# Patient Record
Sex: Female | Born: 1980 | Race: Black or African American | Hispanic: No | Marital: Married | Smoking: Never smoker
Health system: Southern US, Community
[De-identification: ages and names within clinical notes are randomized; demographics above are authoritative.]

## PROBLEM LIST (undated history)

## (undated) ENCOUNTER — Inpatient Hospital Stay (HOSPITAL_COMMUNITY): Payer: Self-pay

## (undated) DIAGNOSIS — R12 Heartburn: Secondary | ICD-10-CM

## (undated) DIAGNOSIS — O26899 Other specified pregnancy related conditions, unspecified trimester: Secondary | ICD-10-CM

## (undated) DIAGNOSIS — Z789 Other specified health status: Secondary | ICD-10-CM

## (undated) DIAGNOSIS — R059 Cough, unspecified: Secondary | ICD-10-CM

## (undated) DIAGNOSIS — R05 Cough: Secondary | ICD-10-CM

## (undated) HISTORY — PX: UMBILICAL HERNIA REPAIR: SHX196

## (undated) HISTORY — PX: HERNIA REPAIR: SHX51

## (undated) HISTORY — PX: FOOT SURGERY: SHX648

---

## 1998-07-09 ENCOUNTER — Emergency Department (HOSPITAL_COMMUNITY): Admission: EM | Admit: 1998-07-09 | Discharge: 1998-07-09 | Payer: Self-pay | Admitting: Emergency Medicine

## 1999-09-26 ENCOUNTER — Other Ambulatory Visit: Admission: RE | Admit: 1999-09-26 | Discharge: 1999-09-26 | Payer: Self-pay | Admitting: Obstetrics

## 1999-12-30 ENCOUNTER — Inpatient Hospital Stay (HOSPITAL_COMMUNITY): Admission: AD | Admit: 1999-12-30 | Discharge: 1999-12-30 | Payer: Self-pay | Admitting: Obstetrics

## 2000-03-30 ENCOUNTER — Inpatient Hospital Stay (HOSPITAL_COMMUNITY): Admission: AD | Admit: 2000-03-30 | Discharge: 2000-03-30 | Payer: Self-pay | Admitting: *Deleted

## 2000-03-30 ENCOUNTER — Inpatient Hospital Stay (HOSPITAL_COMMUNITY): Admission: AD | Admit: 2000-03-30 | Discharge: 2000-04-02 | Payer: Self-pay | Admitting: Obstetrics

## 2001-01-09 ENCOUNTER — Emergency Department (HOSPITAL_COMMUNITY): Admission: EM | Admit: 2001-01-09 | Discharge: 2001-01-09 | Payer: Self-pay

## 2001-02-07 ENCOUNTER — Emergency Department (HOSPITAL_COMMUNITY): Admission: EM | Admit: 2001-02-07 | Discharge: 2001-02-08 | Payer: Self-pay | Admitting: Emergency Medicine

## 2003-05-09 ENCOUNTER — Inpatient Hospital Stay (HOSPITAL_COMMUNITY): Admission: AD | Admit: 2003-05-09 | Discharge: 2003-05-09 | Payer: Self-pay | Admitting: Obstetrics

## 2004-08-30 ENCOUNTER — Inpatient Hospital Stay (HOSPITAL_COMMUNITY): Admission: AD | Admit: 2004-08-30 | Discharge: 2004-08-30 | Payer: Self-pay | Admitting: Obstetrics

## 2004-09-02 ENCOUNTER — Inpatient Hospital Stay (HOSPITAL_COMMUNITY): Admission: AD | Admit: 2004-09-02 | Discharge: 2004-09-02 | Payer: Self-pay | Admitting: Obstetrics

## 2004-12-28 ENCOUNTER — Inpatient Hospital Stay (HOSPITAL_COMMUNITY): Admission: AD | Admit: 2004-12-28 | Discharge: 2004-12-28 | Payer: Self-pay | Admitting: Obstetrics

## 2005-05-30 ENCOUNTER — Inpatient Hospital Stay (HOSPITAL_COMMUNITY): Admission: AD | Admit: 2005-05-30 | Discharge: 2005-05-30 | Payer: Self-pay | Admitting: Obstetrics

## 2005-12-20 ENCOUNTER — Inpatient Hospital Stay (HOSPITAL_COMMUNITY): Admission: AD | Admit: 2005-12-20 | Discharge: 2005-12-20 | Payer: Self-pay | Admitting: Obstetrics

## 2006-10-28 ENCOUNTER — Emergency Department (HOSPITAL_COMMUNITY): Admission: EM | Admit: 2006-10-28 | Discharge: 2006-10-28 | Payer: Self-pay | Admitting: Emergency Medicine

## 2007-01-24 ENCOUNTER — Emergency Department (HOSPITAL_COMMUNITY): Admission: EM | Admit: 2007-01-24 | Discharge: 2007-01-24 | Payer: Self-pay | Admitting: Emergency Medicine

## 2007-03-18 ENCOUNTER — Inpatient Hospital Stay (HOSPITAL_COMMUNITY): Admission: AD | Admit: 2007-03-18 | Discharge: 2007-03-19 | Payer: Self-pay | Admitting: Obstetrics

## 2007-05-09 ENCOUNTER — Emergency Department (HOSPITAL_COMMUNITY): Admission: EM | Admit: 2007-05-09 | Discharge: 2007-05-09 | Payer: Self-pay | Admitting: Emergency Medicine

## 2007-09-06 ENCOUNTER — Emergency Department (HOSPITAL_COMMUNITY): Admission: EM | Admit: 2007-09-06 | Discharge: 2007-09-06 | Payer: Self-pay | Admitting: Emergency Medicine

## 2008-03-01 ENCOUNTER — Emergency Department (HOSPITAL_COMMUNITY): Admission: EM | Admit: 2008-03-01 | Discharge: 2008-03-01 | Payer: Self-pay | Admitting: Emergency Medicine

## 2008-04-13 ENCOUNTER — Emergency Department (HOSPITAL_COMMUNITY): Admission: EM | Admit: 2008-04-13 | Discharge: 2008-04-13 | Payer: Self-pay | Admitting: Emergency Medicine

## 2008-05-31 ENCOUNTER — Emergency Department (HOSPITAL_COMMUNITY): Admission: EM | Admit: 2008-05-31 | Discharge: 2008-05-31 | Payer: Self-pay | Admitting: Emergency Medicine

## 2008-07-16 ENCOUNTER — Emergency Department (HOSPITAL_COMMUNITY): Admission: EM | Admit: 2008-07-16 | Discharge: 2008-07-16 | Payer: Self-pay | Admitting: Family Medicine

## 2009-11-18 ENCOUNTER — Emergency Department (HOSPITAL_COMMUNITY): Admission: EM | Admit: 2009-11-18 | Discharge: 2009-11-18 | Payer: Self-pay | Admitting: Family Medicine

## 2009-12-05 ENCOUNTER — Emergency Department (HOSPITAL_COMMUNITY): Admission: EM | Admit: 2009-12-05 | Discharge: 2009-12-05 | Payer: Self-pay | Admitting: Emergency Medicine

## 2010-02-28 ENCOUNTER — Ambulatory Visit (HOSPITAL_COMMUNITY)
Admission: RE | Admit: 2010-02-28 | Discharge: 2010-02-28 | Payer: Self-pay | Source: Home / Self Care | Attending: General Surgery | Admitting: General Surgery

## 2010-03-19 ENCOUNTER — Emergency Department (HOSPITAL_COMMUNITY)
Admission: EM | Admit: 2010-03-19 | Discharge: 2010-03-19 | Payer: Self-pay | Source: Home / Self Care | Admitting: Emergency Medicine

## 2010-05-29 LAB — COMPREHENSIVE METABOLIC PANEL
ALT: 20 U/L (ref 0–35)
Albumin: 4 g/dL (ref 3.5–5.2)
Alkaline Phosphatase: 81 U/L (ref 39–117)
CO2: 26 mEq/L (ref 19–32)
Creatinine, Ser: 0.97 mg/dL (ref 0.4–1.2)
GFR calc Af Amer: >60 mL/min (ref >60)
Glucose, Bld: 113 mg/dL — ABNORMAL HIGH (ref 70–99)
Potassium: 3.3 mEq/L — ABNORMAL LOW (ref 3.5–5.1)
Sodium: 139 mEq/L (ref 135–145)
Total Bilirubin: 0.6 mg/dL (ref 0.3–1.2)

## 2010-05-29 LAB — DIFFERENTIAL
Basophils Absolute: 0 10*3/uL (ref 0.0–0.1)
Basophils Relative: 0 % (ref 0–1)
Eosinophils Relative: 0 % (ref 0–5)
Lymphocytes Relative: 18 % (ref 12–46)
Monocytes Relative: 19 % — ABNORMAL HIGH (ref 3–12)

## 2010-05-29 LAB — CBC
Hemoglobin: 13.7 g/dL (ref 12.0–15.0)
MCH: 29 pg (ref 26.0–34.0)
MCHC: 33.1 g/dL (ref 30.0–36.0)
MCHC: 31.7 g/dL (ref 30.0–36.0)
MCV: 87.7 fL (ref 78.0–100.0)
RBC: 4.32 MIL/uL (ref 3.87–5.11)
RDW: 13.8 % (ref 11.5–15.5)
WBC: 7.2 10*3/uL (ref 4.0–10.5)
WBC: 5.6 10*3/uL (ref 4.0–10.5)

## 2010-05-29 LAB — URINALYSIS, ROUTINE W REFLEX MICROSCOPIC
Leukocytes, UA: NEGATIVE
pH: 6 (ref 5.0–8.0)

## 2010-05-29 LAB — URINE MICROSCOPIC-ADD ON

## 2010-05-29 LAB — SURGICAL PCR SCREEN
MRSA, PCR: NEGATIVE
Staphylococcus aureus: NEGATIVE

## 2010-06-01 ENCOUNTER — Emergency Department (HOSPITAL_COMMUNITY): Payer: No Typology Code available for payment source

## 2010-06-01 ENCOUNTER — Emergency Department (HOSPITAL_COMMUNITY)
Admission: EM | Admit: 2010-06-01 | Discharge: 2010-06-01 | Disposition: A | Discharge status: Home / Self Care | Payer: No Typology Code available for payment source | Attending: Emergency Medicine | Admitting: Emergency Medicine

## 2010-06-01 DIAGNOSIS — M545 Low back pain, unspecified: Secondary | ICD-10-CM | POA: Insufficient documentation

## 2010-06-01 DIAGNOSIS — M79609 Pain in unspecified limb: Secondary | ICD-10-CM | POA: Insufficient documentation

## 2010-06-01 LAB — POCT URINALYSIS DIPSTICK
Nitrite: NEGATIVE
Protein, ur: NEGATIVE mg/dL
Specific Gravity, Urine: >=1.03 (ref 1.005–1.030)
pH: 6 (ref 5.0–8.0)

## 2010-06-01 LAB — POCT PREGNANCY, URINE: Preg Test, Ur: NEGATIVE

## 2010-06-28 LAB — HERPES SIMPLEX VIRUS CULTURE: Culture: DETECTED

## 2010-06-28 LAB — CULTURE, ROUTINE-ABSCESS: Gram Stain: NONE SEEN

## 2010-06-28 LAB — WET PREP, GENITAL: Yeast Wet Prep HPF POC: NONE SEEN

## 2010-06-29 LAB — POCT URINALYSIS DIP (DEVICE)
Protein, ur: NEGATIVE mg/dL
pH: 7 (ref 5.0–8.0)

## 2010-07-03 LAB — POCT URINALYSIS DIP (DEVICE)
Bilirubin Urine: NEGATIVE
Hgb urine dipstick: NEGATIVE
Specific Gravity, Urine: 1.015 (ref 1.005–1.030)

## 2010-07-03 LAB — WET PREP, GENITAL
WBC, Wet Prep HPF POC: NONE SEEN
Yeast Wet Prep HPF POC: NONE SEEN

## 2010-07-03 LAB — GC/CHLAMYDIA PROBE AMP, GENITAL: GC Probe Amp, Genital: NEGATIVE

## 2010-07-04 ENCOUNTER — Ambulatory Visit: Payer: No Typology Code available for payment source | Admitting: Rehabilitative and Restorative Service Providers"

## 2010-07-11 ENCOUNTER — Ambulatory Visit: Payer: Medicaid Other | Attending: Orthopedic Surgery

## 2010-07-24 ENCOUNTER — Ambulatory Visit: Payer: No Typology Code available for payment source | Admitting: Physical Therapy

## 2010-07-25 ENCOUNTER — Ambulatory Visit: Payer: No Typology Code available for payment source | Attending: Orthopedic Surgery

## 2010-07-25 DIAGNOSIS — R293 Abnormal posture: Secondary | ICD-10-CM | POA: Insufficient documentation

## 2010-07-25 DIAGNOSIS — R51 Headache: Secondary | ICD-10-CM | POA: Insufficient documentation

## 2010-07-25 DIAGNOSIS — IMO0001 Reserved for inherently not codable concepts without codable children: Secondary | ICD-10-CM | POA: Insufficient documentation

## 2010-07-25 DIAGNOSIS — R5381 Other malaise: Secondary | ICD-10-CM | POA: Insufficient documentation

## 2010-07-25 DIAGNOSIS — R109 Unspecified abdominal pain: Secondary | ICD-10-CM | POA: Insufficient documentation

## 2010-07-25 DIAGNOSIS — M542 Cervicalgia: Secondary | ICD-10-CM | POA: Insufficient documentation

## 2010-08-02 ENCOUNTER — Ambulatory Visit: Payer: No Typology Code available for payment source

## 2010-08-09 ENCOUNTER — Ambulatory Visit: Payer: No Typology Code available for payment source | Admitting: Physical Therapy

## 2010-12-08 LAB — WET PREP, GENITAL
Trich, Wet Prep: NONE SEEN
Yeast Wet Prep HPF POC: NONE SEEN

## 2010-12-08 LAB — PREGNANCY, URINE: Preg Test, Ur: NEGATIVE

## 2010-12-08 LAB — URINALYSIS, ROUTINE W REFLEX MICROSCOPIC
Glucose, UA: NEGATIVE
Ketones, ur: NEGATIVE
Leukocytes, UA: NEGATIVE
Nitrite: NEGATIVE
Protein, ur: NEGATIVE
Specific Gravity, Urine: 1.023
Urobilinogen, UA: 1
pH: 6.5

## 2010-12-08 LAB — URINE MICROSCOPIC-ADD ON

## 2010-12-14 LAB — POCT URINALYSIS DIP (DEVICE)
Bilirubin Urine: NEGATIVE
Ketones, ur: NEGATIVE
Operator id: 282151
pH: 7

## 2010-12-14 LAB — WET PREP, GENITAL
Clue Cells Wet Prep HPF POC: NONE SEEN
Yeast Wet Prep HPF POC: NONE SEEN

## 2010-12-14 LAB — POCT PREGNANCY, URINE: Operator id: 282151

## 2010-12-21 LAB — POCT URINALYSIS DIP (DEVICE)
Hgb urine dipstick: NEGATIVE
Protein, ur: NEGATIVE mg/dL
Specific Gravity, Urine: 1.015 (ref 1.005–1.030)
Urobilinogen, UA: 0.2 mg/dL (ref 0.0–1.0)
pH: 6.5 (ref 5.0–8.0)

## 2010-12-21 LAB — GC/CHLAMYDIA PROBE AMP, GENITAL
Chlamydia, DNA Probe: NEGATIVE
GC Probe Amp, Genital: NEGATIVE

## 2010-12-22 LAB — URINE MICROSCOPIC-ADD ON

## 2010-12-22 LAB — CBC
HCT: 31.9 — ABNORMAL LOW
Hemoglobin: 10.7 — ABNORMAL LOW
RDW: 14.5

## 2010-12-22 LAB — POCT PREGNANCY, URINE: Preg Test, Ur: POSITIVE

## 2010-12-22 LAB — URINALYSIS, ROUTINE W REFLEX MICROSCOPIC
Bilirubin Urine: NEGATIVE
Nitrite: NEGATIVE
Specific Gravity, Urine: 1.015
Urobilinogen, UA: 0.2

## 2010-12-22 LAB — GC/CHLAMYDIA PROBE AMP, GENITAL: GC Probe Amp, Genital: NEGATIVE

## 2010-12-22 LAB — WET PREP, GENITAL
Clue Cells Wet Prep HPF POC: NONE SEEN
Trich, Wet Prep: NONE SEEN
Yeast Wet Prep HPF POC: NONE SEEN

## 2010-12-26 LAB — POCT URINALYSIS DIP (DEVICE)
Hgb urine dipstick: NEGATIVE
Ketones, ur: NEGATIVE
Protein, ur: NEGATIVE
Specific Gravity, Urine: 1.025
pH: 6.5

## 2010-12-26 LAB — WET PREP, GENITAL: Trich, Wet Prep: NONE SEEN

## 2010-12-26 LAB — POCT PREGNANCY, URINE: Operator id: 116391

## 2011-05-06 ENCOUNTER — Ambulatory Visit (INDEPENDENT_AMBULATORY_CARE_PROVIDER_SITE_OTHER): Payer: BC Managed Care – PPO | Admitting: Family Medicine

## 2011-05-06 VITALS — BP 96/63 | HR 80 | Temp 98.7°F | Resp 12 | Ht 62.0 in | Wt 180.0 lb

## 2011-05-06 DIAGNOSIS — N939 Abnormal uterine and vaginal bleeding, unspecified: Secondary | ICD-10-CM

## 2011-05-06 DIAGNOSIS — N898 Other specified noninflammatory disorders of vagina: Secondary | ICD-10-CM
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
## 2011-05-11 ENCOUNTER — Other Ambulatory Visit (HOSPITAL_COMMUNITY): Payer: Self-pay | Admitting: Obstetrics

## 2011-05-11 DIAGNOSIS — IMO0002 Reserved for concepts with insufficient information to code with codable children: Secondary | ICD-10-CM

## 2011-05-16 ENCOUNTER — Ambulatory Visit (HOSPITAL_COMMUNITY): Payer: No Typology Code available for payment source

## 2011-05-18 ENCOUNTER — Ambulatory Visit (HOSPITAL_COMMUNITY)
Admission: RE | Admit: 2011-05-18 | Discharge: 2011-05-18 | Disposition: A | Discharge status: Home / Self Care | Payer: BC Managed Care – PPO | Source: Ambulatory Visit | Attending: Obstetrics | Admitting: Obstetrics

## 2011-05-18 DIAGNOSIS — IMO0002 Reserved for concepts with insufficient information to code with codable children: Secondary | ICD-10-CM | POA: Insufficient documentation

## 2013-06-03 ENCOUNTER — Ambulatory Visit: Payer: BC Managed Care – PPO

## 2013-09-05 ENCOUNTER — Encounter (HOSPITAL_COMMUNITY): Payer: Self-pay | Admitting: *Deleted

## 2013-09-05 ENCOUNTER — Inpatient Hospital Stay (HOSPITAL_COMMUNITY)
Admission: AD | Admit: 2013-09-05 | Discharge: 2013-09-05 | Disposition: A | Discharge status: Home / Self Care | Payer: BC Managed Care – PPO | Source: Ambulatory Visit | Attending: Obstetrics | Admitting: Obstetrics

## 2013-09-05 DIAGNOSIS — O99891 Other specified diseases and conditions complicating pregnancy: Secondary | ICD-10-CM | POA: Insufficient documentation

## 2013-09-05 DIAGNOSIS — Y92410 Unspecified street and highway as the place of occurrence of the external cause: Secondary | ICD-10-CM | POA: Insufficient documentation

## 2013-09-05 DIAGNOSIS — Z711 Person with feared health complaint in whom no diagnosis is made: Secondary | ICD-10-CM

## 2013-09-05 DIAGNOSIS — O9989 Other specified diseases and conditions complicating pregnancy, childbirth and the puerperium: Principal | ICD-10-CM

## 2013-09-05 HISTORY — DX: Other specified health status: Z78.9

## 2013-09-05 LAB — POCT PREGNANCY, URINE: Preg Test, Ur: POSITIVE — AB

## 2013-09-05 NOTE — MAU Provider Note (Signed)
Attestation of Attending Supervision of Advanced Practitioner (CNM/NP): Evaluation and management procedures were performed by the Advanced Practitioner under my supervision and collaboration.  I have reviewed the Advanced Practitioner's note and chart, and I agree with the management and plan.  Barbara Yang,Barbara Yang 09/05/2013 9:59 PM
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
## 2013-09-21 ENCOUNTER — Other Ambulatory Visit (HOSPITAL_COMMUNITY): Payer: Self-pay | Admitting: Obstetrics

## 2013-09-21 ENCOUNTER — Encounter (HOSPITAL_COMMUNITY): Payer: Self-pay

## 2013-09-21 ENCOUNTER — Inpatient Hospital Stay (HOSPITAL_COMMUNITY)
Admission: AD | Admit: 2013-09-21 | Discharge: 2013-09-21 | Disposition: A | Discharge status: Home / Self Care | Payer: BC Managed Care – PPO | Source: Ambulatory Visit | Attending: Obstetrics | Admitting: Obstetrics

## 2013-09-21 ENCOUNTER — Inpatient Hospital Stay (HOSPITAL_COMMUNITY): Payer: BC Managed Care – PPO

## 2013-09-21 DIAGNOSIS — O021 Missed abortion: Secondary | ICD-10-CM

## 2013-09-21 LAB — URINALYSIS, ROUTINE W REFLEX MICROSCOPIC
BILIRUBIN URINE: NEGATIVE
Glucose, UA: NEGATIVE mg/dL
KETONES UR: 15 mg/dL — AB
Leukocytes, UA: NEGATIVE
Nitrite: NEGATIVE
PH: 6 (ref 5.0–8.0)
Protein, ur: NEGATIVE mg/dL
SPECIFIC GRAVITY, URINE: 1.025 (ref 1.005–1.030)
UROBILINOGEN UA: 1 mg/dL (ref 0.0–1.0)

## 2013-09-21 LAB — URINE MICROSCOPIC-ADD ON
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
## 2013-09-21 NOTE — Discharge Instructions (Signed)
Miscarriage A miscarriage is the sudden loss of an unborn baby (fetus) before the 20th week of pregnancy. Most miscarriages happen in the first 3 months of pregnancy. Sometimes, it happens before a woman even knows she is pregnant. A miscarriage is also called a "spontaneous miscarriage" or "early pregnancy loss." Having a miscarriage can be an emotional experience. Talk with your caregiver about any questions you may have about miscarrying, the grieving process, and your future pregnancy plans. CAUSES   Problems with the fetal chromosomes that make it impossible for the baby to develop normally. Problems with the baby's genes or chromosomes are most often the result of errors that occur, by chance, as the embryo divides and grows. The problems are not inherited from the parents.  Infection of the cervix or uterus.   Hormone problems.   Problems with the cervix, such as having an incompetent cervix. This is when the tissue in the cervix is not strong enough to hold the pregnancy.   Problems with the uterus, such as an abnormally shaped uterus, uterine fibroids, or congenital abnormalities.   Certain medical conditions.   Smoking, drinking alcohol, or taking illegal drugs.   Trauma.  Often, the cause of a miscarriage is unknown.  SYMPTOMS   Vaginal bleeding or spotting, with or without cramps or pain.  Pain or cramping in the abdomen or lower back.  Passing fluid, tissue, or blood clots from the vagina. DIAGNOSIS  Your caregiver will perform a physical exam. You may also have an ultrasound to confirm the miscarriage. Blood or urine tests may also be ordered. TREATMENT   Sometimes, treatment is not necessary if you naturally pass all the fetal tissue that was in the uterus. If some of the fetus or placenta remains in the body (incomplete miscarriage), tissue left behind may become infected and must be removed. Usually, a dilation and curettage (D and C) procedure is performed.  During a D and C procedure, the cervix is widened (dilated) and any remaining fetal or placental tissue is gently removed from the uterus.  Antibiotic medicines are prescribed if there is an infection. Other medicines may be given to reduce the size of the uterus (contract) if there is a lot of bleeding.  If you have Rh negative blood and your baby was Rh positive, you will need a Rh immunoglobulin shot. This shot will protect any future baby from having Rh blood problems in future pregnancies. HOME CARE INSTRUCTIONS   Your caregiver may order bed rest or may allow you to continue light activity. Resume activity as directed by your caregiver.  Have someone help with home and family responsibilities during this time.   Keep track of the number of sanitary pads you use each day and how soaked (saturated) they are. Write down this information.   Do not use tampons. Do not douche or have sexual intercourse until approved by your caregiver.   Only take over-the-counter or prescription medicines for pain or discomfort as directed by your caregiver.   Do not take aspirin. Aspirin can cause bleeding.   Keep all follow-up appointments with your caregiver.   If you or your partner have problems with grieving, talk to your caregiver or seek counseling to help cope with the pregnancy loss. Allow enough time to grieve before trying to get pregnant again.  SEEK IMMEDIATE MEDICAL CARE IF:   You have severe cramps or pain in your back or abdomen.  You have a fever.  You pass large blood clots (walnut-sized   or larger) ortissue from your vagina. Save any tissue for your caregiver to inspect.   Your bleeding increases.   You have a thick, bad-smelling vaginal discharge.  You become lightheaded, weak, or you faint.   You have chills.  MAKE SURE YOU:  Understand these instructions.  Will watch your condition.  Will get help right away if you are not doing well or get  worse. Document Released: 08/29/2000 Document Revised: 06/30/2012 Document Reviewed: 04/24/2011 ExitCare Patient Information 2015 [employer]. This information is not intended to replace advice given to you by your health care provider. Make sure you discuss any questions you have with your health care provider.  

## 2013-09-21 NOTE — MAU Note (Signed)
Patient states she was in the office today and Dr. Gaynell FaceMarshall did not hear a fetal heart beat and sent to MAU . States she has had spotting on and off. Denies pain.

## 2014-01-18 ENCOUNTER — Encounter (HOSPITAL_COMMUNITY): Payer: Self-pay

## 2014-02-22 LAB — OB RESULTS CONSOLE RUBELLA ANTIBODY, IGM: RUBELLA: IMMUNE

## 2014-02-22 LAB — OB RESULTS CONSOLE ABO/RH: RH Type: POSITIVE

## 2014-02-22 LAB — OB RESULTS CONSOLE GC/CHLAMYDIA
CHLAMYDIA, DNA PROBE: NEGATIVE
Gonorrhea: NEGATIVE

## 2014-02-22 LAB — OB RESULTS CONSOLE ANTIBODY SCREEN: ANTIBODY SCREEN: NEGATIVE

## 2014-02-22 LAB — OB RESULTS CONSOLE HEPATITIS B SURFACE ANTIGEN: Hepatitis B Surface Ag: NEGATIVE

## 2014-02-22 LAB — OB RESULTS CONSOLE HIV ANTIBODY (ROUTINE TESTING): HIV: NONREACTIVE

## 2014-02-22 LAB — OB RESULTS CONSOLE RPR: RPR: NONREACTIVE

## 2014-03-13 ENCOUNTER — Encounter (HOSPITAL_COMMUNITY): Payer: Self-pay

## 2014-03-13 ENCOUNTER — Inpatient Hospital Stay (HOSPITAL_COMMUNITY)
Admission: AD | Admit: 2014-03-13 | Discharge: 2014-03-13 | Disposition: A | Discharge status: Home / Self Care | Payer: BC Managed Care – PPO | Source: Ambulatory Visit | Attending: Obstetrics | Admitting: Obstetrics

## 2014-03-13 DIAGNOSIS — O209 Hemorrhage in early pregnancy, unspecified: Secondary | ICD-10-CM | POA: Diagnosis present

## 2014-03-13 DIAGNOSIS — Z3A11 11 weeks gestation of pregnancy: Secondary | ICD-10-CM | POA: Diagnosis not present

## 2014-03-13 DIAGNOSIS — O200 Threatened abortion: Secondary | ICD-10-CM | POA: Diagnosis not present

## 2014-03-13 DIAGNOSIS — N930 Postcoital and contact bleeding: Secondary | ICD-10-CM

## 2014-03-13 DIAGNOSIS — O26851 Spotting complicating pregnancy, first trimester: Secondary | ICD-10-CM | POA: Diagnosis not present

## 2014-03-13 NOTE — MAU Note (Signed)
Pt states here for spotting that began today. Last intercourse 03/11/2014. Hx miscarriage in July. Denies uti s/s.

## 2014-03-13 NOTE — MAU Provider Note (Signed)
CSN: [id number]     Arrival date & time 03/13/14  1215 History   None    Chief Complaint  Patient presents with  . Vaginal Bleeding     (Consider location/radiation/quality/duration/timing/severity/associated sxs/prior Treatment) Patient is a 33 y.o. female presenting with vaginal bleeding. The history is provided by the patient.  Vaginal Bleeding This is a new problem. The current episode started today. The problem has been rapidly improving. She has tried nothing for the symptoms.  Barbara Yang is a 33 y.o. [phone] [id number] @ 4378w2d gestation who presents to the MAU with vaginal bleeding. She states that when she wiped this morning she saw pink on the tissue. When she got to MAU and went to the bathroom it happened again. Last sexual intercourse 03/11/14. Patient denies abdominal pain or other problems. She is getting her care with Dr. Gaynell FaceMarshall and has had an ultrasound. Her next appointment is Jan. 4th. She has had a miscarriage in the past and got upset when she saw the blood today.   Past Medical History  Diagnosis Date  . Medical history non-contributory    Past Surgical History  Procedure Laterality Date  . Umbilical hernia repair     Family History  Problem Relation Age of Onset  . Cancer Mother    History  Substance Use Topics  . Smoking status: Never Smoker   . Smokeless tobacco: Never Used  . Alcohol Use: No   OB History    Gravida Para Term Preterm AB TAB SAB Ectopic Multiple Living   7 1 1  0 4 0 4 0 0 1     Review of Systems  Genitourinary: Positive for vaginal bleeding.  all other systems negative    Allergies  Review of patient's allergies indicates no known allergies.  Home Medications   Prior to Admission medications   Medication Sig Start Date End Date Taking? Authorizing Provider  calcium carbonate (TUMS - DOSED IN MG ELEMENTAL CALCIUM) 500 MG chewable tablet Chew 2 tablets by mouth daily.    Historical Provider, MD  Prenatal Vit-Fe Fumarate-FA  (PRENATAL MULTIVITAMIN) TABS tablet Take 1 tablet by mouth daily.     Historical Provider, MD   BP 125/66 mmHg  Pulse 90  Temp(Src) 98.3 F (36.8 C) (Oral)  Resp 20  Ht 5\' 2"  (1.575 m)  Wt 223 lb (101.152 kg)  BMI 40.78 kg/m2  LMP 12/24/2013  Breastfeeding? Unknown Physical Exam  Constitutional: She is oriented to person, place, and time. She appears well-developed and well-nourished. No distress.  Eyes: Conjunctivae and EOM are normal.  Neck: Neck supple.  Cardiovascular: Normal rate.   Pulmonary/Chest: Effort normal.  Abdominal: Soft. There is no tenderness.  Doppler FHT's 169  Genitourinary:  External genitalia without lesions, scant dark discharge vaginal vault. Cervix closed, no CMT, no adnexal tenderness. Uterus 10-12 week size.   Musculoskeletal: Normal range of motion.  Neurological: She is alert and oriented to person, place, and time. No cranial nerve deficit.  Skin: Skin is warm and dry.  Psychiatric: She has a normal mood and affect. Her behavior is normal.  Nursing note and vitals reviewed.   ED Course  Procedures   MDM  33 y.o. female @ 3978w2d gestation with onset of spotting this am. Stable for discharge without active bleeding and positive FHT's @ 169/min. Pelvic rest, return for worsening symptoms otherwise keep appointment next week with Dr. Gaynell FaceMarshall.

## 2014-03-13 NOTE — Discharge Instructions (Signed)

## 2014-03-19 NOTE — L&D Delivery Note (Signed)
Delivery Note At 5:15 PM a viable female was delivered via Vaginal, Spontaneous Delivery (Presentation: ;  ).  APGAR: 9, 9; weight  .   Placenta status: Intact, Spontaneous.  Cord: 3 vessels with the following complications: None.  Cord pH: not done  Anesthesia: Epidural  Episiotomy: None Lacerations: 1st degree Suture Repair: 2.0 Est. Blood Loss (mL):    Mom to postpartum.  Baby to Couplet care / Skin to Skin.  MARSHALL,BERNARD A 09/26/2014, 5:29 PM

## 2014-04-22 ENCOUNTER — Encounter (HOSPITAL_COMMUNITY)
Admission: EM | Disposition: A | Discharge status: Home / Self Care | Payer: Self-pay | Source: Home / Self Care | Attending: Emergency Medicine

## 2014-04-22 ENCOUNTER — Observation Stay (HOSPITAL_COMMUNITY): Payer: BLUE CROSS/BLUE SHIELD

## 2014-04-22 ENCOUNTER — Observation Stay (HOSPITAL_COMMUNITY)
Admission: EM | Admit: 2014-04-22 | Discharge: 2014-04-23 | Disposition: A | Discharge status: Home / Self Care | Payer: BLUE CROSS/BLUE SHIELD | Attending: Orthopedic Surgery | Admitting: Orthopedic Surgery

## 2014-04-22 ENCOUNTER — Observation Stay (HOSPITAL_COMMUNITY): Payer: BLUE CROSS/BLUE SHIELD | Admitting: Anesthesiology

## 2014-04-22 ENCOUNTER — Emergency Department (HOSPITAL_COMMUNITY): Payer: BLUE CROSS/BLUE SHIELD

## 2014-04-22 ENCOUNTER — Encounter (HOSPITAL_COMMUNITY): Payer: Self-pay | Admitting: *Deleted

## 2014-04-22 DIAGNOSIS — Z3A17 17 weeks gestation of pregnancy: Secondary | ICD-10-CM | POA: Diagnosis not present

## 2014-04-22 DIAGNOSIS — S93326A Dislocation of tarsometatarsal joint of unspecified foot, initial encounter: Secondary | ICD-10-CM | POA: Diagnosis present

## 2014-04-22 DIAGNOSIS — W1789XA Other fall from one level to another, initial encounter: Secondary | ICD-10-CM | POA: Insufficient documentation

## 2014-04-22 DIAGNOSIS — S93325A Dislocation of tarsometatarsal joint of left foot, initial encounter: Secondary | ICD-10-CM | POA: Diagnosis present

## 2014-04-22 DIAGNOSIS — Y929 Unspecified place or not applicable: Secondary | ICD-10-CM | POA: Insufficient documentation

## 2014-04-22 DIAGNOSIS — O26892 Other specified pregnancy related conditions, second trimester: Principal | ICD-10-CM | POA: Insufficient documentation

## 2014-04-22 DIAGNOSIS — Z3482 Encounter for supervision of other normal pregnancy, second trimester: Secondary | ICD-10-CM

## 2014-04-22 DIAGNOSIS — S92302A Fracture of unspecified metatarsal bone(s), left foot, initial encounter for closed fracture: Secondary | ICD-10-CM

## 2014-04-22 DIAGNOSIS — Z3490 Encounter for supervision of normal pregnancy, unspecified, unspecified trimester: Secondary | ICD-10-CM

## 2014-04-22 DIAGNOSIS — W19XXXA Unspecified fall, initial encounter: Secondary | ICD-10-CM

## 2014-04-22 DIAGNOSIS — Z419 Encounter for procedure for purposes other than remedying health state, unspecified: Secondary | ICD-10-CM

## 2014-04-22 HISTORY — PX: ORIF ANKLE FRACTURE: SHX5408

## 2014-04-22 LAB — CBC
HEMATOCRIT: 30.7 % — AB (ref 36.0–46.0)
Hemoglobin: 10.2 g/dL — ABNORMAL LOW (ref 12.0–15.0)
MCH: 28.6 pg (ref 26.0–34.0)
MCHC: 33.2 g/dL (ref 30.0–36.0)
MCV: 86 fL (ref 78.0–100.0)
Platelets: 278 10*3/uL (ref 150–400)
RBC: 3.57 MIL/uL — AB (ref 3.87–5.11)
RDW: 13.7 % (ref 11.5–15.5)
WBC: 10.5 10*3/uL (ref 4.0–10.5)

## 2014-04-22 LAB — SURGICAL PCR SCREEN
MRSA, PCR: NEGATIVE
Staphylococcus aureus: POSITIVE — AB

## 2014-04-22 SURGERY — OPEN REDUCTION INTERNAL FIXATION (ORIF) ANKLE FRACTURE
Anesthesia: General | Laterality: Left

## 2014-04-22 MED ORDER — PROPOFOL 10 MG/ML IV BOLUS
INTRAVENOUS | Status: DC | PRN
  Administered 2014-04-22: 200 mg via INTRAVENOUS

## 2014-04-22 MED ORDER — MEPERIDINE HCL 25 MG/ML IJ SOLN: 6.2500 mg | INTRAMUSCULAR | Status: DC | PRN

## 2014-04-22 MED ORDER — ONDANSETRON HCL 4 MG/2ML IJ SOLN
INTRAMUSCULAR | Status: AC
  Filled 2014-04-22: qty 2

## 2014-04-22 MED ORDER — 0.9 % SODIUM CHLORIDE (POUR BTL) OPTIME
TOPICAL | Status: DC | PRN
  Administered 2014-04-22: 1000 mL

## 2014-04-22 MED ORDER — FENTANYL CITRATE 0.05 MG/ML IJ SOLN
50.0000 ug | INTRAMUSCULAR | Status: DC | PRN
  Administered 2014-04-22: 50 ug via INTRAVENOUS

## 2014-04-22 MED ORDER — METOCLOPRAMIDE HCL 10 MG PO TABS: 5.0000 mg | ORAL_TABLET | Freq: Three times a day (TID) | ORAL | Status: DC | PRN

## 2014-04-22 MED ORDER — ACETAMINOPHEN 325 MG PO TABS
325.0000 mg | ORAL_TABLET | Freq: Once | ORAL | Status: AC
Start: 2014-04-22 — End: 2014-04-22
  Administered 2014-04-22: 325 mg via ORAL
  Filled 2014-04-22: qty 1

## 2014-04-22 MED ORDER — SUCCINYLCHOLINE CHLORIDE 20 MG/ML IJ SOLN
INTRAMUSCULAR | Status: DC | PRN
  Administered 2014-04-22: 120 mg via INTRAVENOUS

## 2014-04-22 MED ORDER — METOCLOPRAMIDE HCL 5 MG/ML IJ SOLN: 5.0000 mg | Freq: Three times a day (TID) | INTRAMUSCULAR | Status: DC | PRN

## 2014-04-22 MED ORDER — FENTANYL CITRATE 0.05 MG/ML IJ SOLN
INTRAMUSCULAR | Status: AC
  Filled 2014-04-22: qty 2

## 2014-04-22 MED ORDER — CEFAZOLIN SODIUM-DEXTROSE 2-3 GM-% IV SOLR
2.0000 g | Freq: Once | INTRAVENOUS | Status: AC
  Administered 2014-04-22: 2 g via INTRAVENOUS

## 2014-04-22 MED ORDER — LIDOCAINE HCL (CARDIAC) 20 MG/ML IV SOLN
INTRAVENOUS | Status: DC | PRN
  Administered 2014-04-22: 50 mg via INTRAVENOUS

## 2014-04-22 MED ORDER — PHENYLEPHRINE HCL 10 MG/ML IJ SOLN
INTRAMUSCULAR | Status: DC | PRN
  Administered 2014-04-22 (×3): 80 ug via INTRAVENOUS

## 2014-04-22 MED ORDER — POTASSIUM CHLORIDE IN NACL 20-0.9 MEQ/L-% IV SOLN
INTRAVENOUS | Status: DC
  Administered 2014-04-23: via INTRAVENOUS
  Filled 2014-04-22: qty 1000

## 2014-04-22 MED ORDER — ONDANSETRON HCL 4 MG PO TABS: 4.0000 mg | ORAL_TABLET | Freq: Four times a day (QID) | ORAL | Status: DC | PRN

## 2014-04-22 MED ORDER — FENTANYL CITRATE 0.05 MG/ML IJ SOLN: 25.0000 ug | INTRAMUSCULAR | Status: DC | PRN

## 2014-04-22 MED ORDER — FENTANYL CITRATE 0.05 MG/ML IJ SOLN
INTRAMUSCULAR | Status: AC
  Filled 2014-04-22: qty 5

## 2014-04-22 MED ORDER — ONDANSETRON HCL 4 MG/2ML IJ SOLN
INTRAMUSCULAR | Status: DC | PRN
  Administered 2014-04-22: 4 mg via INTRAVENOUS

## 2014-04-22 MED ORDER — MIDAZOLAM HCL 2 MG/2ML IJ SOLN: 1.0000 mg | INTRAMUSCULAR | Status: DC | PRN

## 2014-04-22 MED ORDER — CEFAZOLIN SODIUM 1-5 GM-% IV SOLN
1.0000 g | Freq: Four times a day (QID) | INTRAVENOUS | Status: AC
  Administered 2014-04-22 – 2014-04-23 (×3): 1 g via INTRAVENOUS
  Filled 2014-04-22 (×3): qty 50

## 2014-04-22 MED ORDER — HYDROCODONE-ACETAMINOPHEN 5-325 MG PO TABS
1.0000 | ORAL_TABLET | Freq: Four times a day (QID) | ORAL | Status: DC | PRN
Start: 2014-04-22 — End: 2014-04-23

## 2014-04-22 MED ORDER — MORPHINE SULFATE 4 MG/ML IJ SOLN: 4.0000 mg | Freq: Once | INTRAMUSCULAR | Status: DC

## 2014-04-22 MED ORDER — ONDANSETRON HCL 4 MG/2ML IJ SOLN: 4.0000 mg | Freq: Four times a day (QID) | INTRAMUSCULAR | Status: DC | PRN

## 2014-04-22 MED ORDER — MORPHINE SULFATE 2 MG/ML IJ SOLN: 1.0000 mg | INTRAMUSCULAR | Status: DC | PRN

## 2014-04-22 MED ORDER — LACTATED RINGERS IV SOLN
INTRAVENOUS | Status: DC
  Administered 2014-04-22: 12:00:00 via INTRAVENOUS

## 2014-04-22 MED ORDER — CEFAZOLIN SODIUM-DEXTROSE 2-3 GM-% IV SOLR
INTRAVENOUS | Status: AC
  Filled 2014-04-22: qty 50

## 2014-04-22 MED ORDER — SODIUM CHLORIDE 0.9 % IV SOLN: INTRAVENOUS | Status: AC

## 2014-04-22 MED ORDER — ACETAMINOPHEN 325 MG PO TABS
325.0000 mg | ORAL_TABLET | Freq: Four times a day (QID) | ORAL | Status: DC | PRN
  Administered 2014-04-23 (×2): 650 mg via ORAL
  Filled 2014-04-22 (×2): qty 2

## 2014-04-22 MED ORDER — PROMETHAZINE HCL 25 MG/ML IJ SOLN: 6.2500 mg | INTRAMUSCULAR | Status: DC | PRN

## 2014-04-22 MED ORDER — MIDAZOLAM HCL 2 MG/2ML IJ SOLN
INTRAMUSCULAR | Status: AC
  Filled 2014-04-22: qty 2

## 2014-04-22 MED ORDER — PRENATAL MULTIVITAMIN CH
1.0000 | ORAL_TABLET | Freq: Every day | ORAL | Status: DC
  Administered 2014-04-22 – 2014-04-23 (×2): 1 via ORAL
  Filled 2014-04-22 (×2): qty 1

## 2014-04-22 MED ORDER — LIDOCAINE HCL 4 % MT SOLN
OROMUCOSAL | Status: DC | PRN
  Administered 2014-04-22: 4 mL via TOPICAL

## 2014-04-22 SURGICAL SUPPLY — 68 items
BANDAGE ELASTIC 4 VELCRO ST LF (GAUZE/BANDAGES/DRESSINGS) ×3 IMPLANT
BANDAGE ELASTIC 6 VELCRO ST LF (GAUZE/BANDAGES/DRESSINGS) ×3 IMPLANT
BANDAGE ESMARK 6X9 LF (GAUZE/BANDAGES/DRESSINGS) ×1 IMPLANT
BIT DRILL CANN 2.7X625 NONSTRL (BIT) ×3 IMPLANT
BLADE SURG 10 STRL SS (BLADE) ×3 IMPLANT
BNDG COHESIVE 4X5 TAN STRL (GAUZE/BANDAGES/DRESSINGS) IMPLANT
BNDG ESMARK 6X9 LF (GAUZE/BANDAGES/DRESSINGS) ×3
BNDG GAUZE ELAST 4 BULKY (GAUZE/BANDAGES/DRESSINGS) ×3 IMPLANT
BRUSH SCRUB DISP (MISCELLANEOUS) ×3 IMPLANT
COVER SURGICAL LIGHT HANDLE (MISCELLANEOUS) ×3 IMPLANT
DRAPE C-ARM 42X72 X-RAY (DRAPES) ×3 IMPLANT
DRAPE C-ARMOR (DRAPES) ×3 IMPLANT
DRAPE ORTHO SPLIT 77X108 STRL (DRAPES) ×2
DRAPE PROXIMA HALF (DRAPES) ×3 IMPLANT
DRAPE SURG ORHT 6 SPLT 77X108 (DRAPES) ×1 IMPLANT
DRAPE U-SHAPE 47X51 STRL (DRAPES) ×3 IMPLANT
DRSG EMULSION OIL 3X3 NADH (GAUZE/BANDAGES/DRESSINGS) IMPLANT
DRSG MEPILEX BORDER 4X4 (GAUZE/BANDAGES/DRESSINGS) ×3 IMPLANT
ELECT REM PT RETURN 9FT ADLT (ELECTROSURGICAL) ×3
ELECTRODE REM PT RTRN 9FT ADLT (ELECTROSURGICAL) ×1 IMPLANT
GAUZE SPONGE 4X4 12PLY STRL (GAUZE/BANDAGES/DRESSINGS) IMPLANT
GLOVE BIO SURGEON STRL SZ7.5 (GLOVE) ×3 IMPLANT
GLOVE BIO SURGEON STRL SZ8 (GLOVE) ×3 IMPLANT
GLOVE BIOGEL PI IND STRL 7.5 (GLOVE) ×1 IMPLANT
GLOVE BIOGEL PI IND STRL 8 (GLOVE) ×1 IMPLANT
GLOVE BIOGEL PI INDICATOR 7.5 (GLOVE) ×2
GLOVE BIOGEL PI INDICATOR 8 (GLOVE) ×2
GOWN STRL REUS W/ TWL LRG LVL3 (GOWN DISPOSABLE) ×2 IMPLANT
GOWN STRL REUS W/ TWL XL LVL3 (GOWN DISPOSABLE) ×1 IMPLANT
GOWN STRL REUS W/TWL LRG LVL3 (GOWN DISPOSABLE) ×4
GOWN STRL REUS W/TWL XL LVL3 (GOWN DISPOSABLE) ×2
GUIDEWIRE THREADED 150MM (WIRE) ×3 IMPLANT
KIT BASIN OR (CUSTOM PROCEDURE TRAY) ×3 IMPLANT
KIT ROOM TURNOVER OR (KITS) ×3 IMPLANT
MANIFOLD NEPTUNE II (INSTRUMENTS) IMPLANT
NEEDLE HYPO 21X1.5 SAFETY (NEEDLE) IMPLANT
NS IRRIG 1000ML POUR BTL (IV SOLUTION) ×3 IMPLANT
PACK GENERAL/GYN (CUSTOM PROCEDURE TRAY) ×3 IMPLANT
PAD ARMBOARD 7.5X6 YLW CONV (MISCELLANEOUS) ×6 IMPLANT
PAD CAST 4YDX4 CTTN HI CHSV (CAST SUPPLIES) IMPLANT
PADDING CAST ABS 4INX4YD NS (CAST SUPPLIES) ×2
PADDING CAST ABS 6INX4YD NS (CAST SUPPLIES) ×2
PADDING CAST ABS COTTON 4X4 ST (CAST SUPPLIES) ×1 IMPLANT
PADDING CAST ABS COTTON 6X4 NS (CAST SUPPLIES) ×1 IMPLANT
PADDING CAST COTTON 4X4 STRL (CAST SUPPLIES)
PADDING CAST COTTON 6X4 STRL (CAST SUPPLIES) IMPLANT
PENCIL BUTTON HOLSTER BLD 10FT (ELECTRODE) IMPLANT
SCREW CANN L THRD/34 4.0 (Screw) ×3 IMPLANT
SCREW SHORT THREAD 4.0X34 (Screw) ×3 IMPLANT
SPLINT PLASTER CAST XFAST 5X30 (CAST SUPPLIES) ×1 IMPLANT
SPLINT PLASTER XFAST SET 5X30 (CAST SUPPLIES) ×2
SPONGE LAP 18X18 X RAY DECT (DISPOSABLE) IMPLANT
SPONGE SCRUB IODOPHOR (GAUZE/BANDAGES/DRESSINGS) IMPLANT
STAPLER VISISTAT 35W (STAPLE) IMPLANT
SUCTION FRAZIER TIP 10 FR DISP (SUCTIONS) ×3 IMPLANT
SUT ETHILON 2 0 FS 18 (SUTURE) ×3 IMPLANT
SUT ETHILON 3 0 PS 1 (SUTURE) IMPLANT
SUT PDS AB 2-0 CT1 27 (SUTURE) IMPLANT
SUT VIC AB 2-0 CT1 27 (SUTURE) ×2
SUT VIC AB 2-0 CT1 TAPERPNT 27 (SUTURE) ×1 IMPLANT
SUT VIC AB 2-0 CT3 27 (SUTURE) IMPLANT
SYR CONTROL 10ML LL (SYRINGE) IMPLANT
TOWEL OR 17X24 6PK STRL BLUE (TOWEL DISPOSABLE) ×3 IMPLANT
TOWEL OR 17X26 10 PK STRL BLUE (TOWEL DISPOSABLE) ×6 IMPLANT
TUBE CONNECTING 12'X1/4 (SUCTIONS) ×1
TUBE CONNECTING 12X1/4 (SUCTIONS) ×2 IMPLANT
UNDERPAD 30X30 INCONTINENT (UNDERPADS AND DIAPERS) ×3 IMPLANT
WATER STERILE IRR 1000ML POUR (IV SOLUTION) IMPLANT

## 2014-04-22 NOTE — Transfer of Care (Signed)
Immediate Anesthesia Transfer of Care Note  Patient: Barbara AdasJennifer Leighton  Procedure(s) Performed: Procedure(s): OPEN REDUCTION INTERNAL FIXATION (ORIF) LEFT LISFRANC FRACTURE (Left)  Patient Location: PACU  Anesthesia Type:General  Level of Consciousness: awake, alert  and oriented  Airway & Oxygen Therapy: Patient Spontanous Breathing and Patient connected to nasal cannula oxygen  Post-op Assessment: Report given to RN  Post vital signs: Reviewed and stable  Last Vitals:  Filed Vitals:   04/22/14 1507  BP:   Pulse:   Temp: 36.8 C  Resp:     Complications: No apparent anesthesia complications

## 2014-04-22 NOTE — Progress Notes (Signed)
UR completed 

## 2014-04-22 NOTE — Anesthesia Preprocedure Evaluation (Addendum)
Anesthesia Evaluation  Patient identified by MRN, date of birth, ID band Patient awake    Reviewed: Allergy & Precautions, NPO status , Patient's Chart, lab work & pertinent test results, reviewed documented beta blocker date and time   Airway Mallampati: II   Neck ROM: Full    Dental  (+) Teeth Intact, Dental Advisory Given   Pulmonary neg pulmonary ROS,  breath sounds clear to auscultation        Cardiovascular negative cardio ROS  Rhythm:Regular     Neuro/Psych negative neurological ROS  negative psych ROS   GI/Hepatic negative GI ROS, Neg liver ROS,   Endo/Other  negative endocrine ROS  Renal/GU negative Renal ROS     Musculoskeletal   Abdominal (+) + obese,   Peds  Hematology negative hematology ROS (+)   Anesthesia Other Findings   Reproductive/Obstetrics (+) Pregnancy                            Anesthesia Physical Anesthesia Plan  ASA: II  Anesthesia Plan: Spinal and General   Post-op Pain Management:    Induction: Intravenous  Airway Management Planned: Oral ETT and Nasal Cannula  Additional Equipment:   Intra-op Plan:   Post-operative Plan: Extubation in OR  Informed Consent: I have reviewed the patients History and Physical, chart, labs and discussed the procedure including the risks, benefits and alternatives for the proposed anesthesia with the patient or authorized representative who has indicated his/her understanding and acceptance.     Plan Discussed with:   Anesthesia Plan Comments: (Will offer Spinal, GA, and or popliteal block for post op analgesia, will verify FHT before and after procedure)        Anesthesia Quick Evaluation

## 2014-04-22 NOTE — Anesthesia Procedure Notes (Signed)
Anesthesia Regional Block:  Popliteal block  Pre-Anesthetic Checklist: ,, timeout performed, Correct Patient, Correct Site, Correct Laterality, Correct Procedure, Correct Position, site marked, Risks and benefits discussed,  Surgical consent,  Pre-op evaluation,  At surgeon's request and post-op pain management   Prep: Maximum Sterile Barrier Precautions used and chloraprep       Needles:  Injection technique: Single-shot  Needle Type: Echogenic Stimulator Needle     Needle Length: 10cm 10 cm Needle Gauge: 21 and 21 G    Additional Needles:  Procedures: ultrasound guided (picture in chart) and nerve stimulator Popliteal block  Nerve Stimulator or Paresthesia:  Response: 0.5 mA,   Additional Responses:   Narrative:  Start time: 04/22/2014 12:19 PM End time: 04/22/2014 12:32 PM  Performed by: Personally  Anesthesiologist: Sebastian AcheMANNY, Tymeer Vaquera  Additional Notes: L popliteal block with US and Stimulator.  30ml of .5% marcaine with epi, multiple asp, talked to patient throughout, no complicatiions

## 2014-04-22 NOTE — Progress Notes (Signed)
1547  Dr. Gaynell FaceMarshall notified of doppler FHT's post op=180-190 bpm.  Orders for doppler in AM prior to patient's discharge.

## 2014-04-22 NOTE — ED Provider Notes (Signed)
CSN: [id number]     Arrival date & time 04/22/14  0219 History  This chart was scribed for Barbara Yang Barbara Margee Trentham, MD by Barbara Yang, ED Scribe. This patient was seen in room A06C/A06C and the patient's care was started at 2:48 AM.    Chief Complaint  Patient presents with  . Fall   Patient is a 34 y.o. female presenting with fall. The history is provided by the patient. No language interpreter was used.  Fall     HPI Comments: Barbara Yang is an otherwise healthy 34 y.o. female who presents to the Emergency Department complaining of left knee and left foot and ankle pain. Patient explains she was seated on a stool earlier tonight when her knee got caught, causing her to fall with her leg wrapped in the stool. She bore the brunt of the fall on her buttocks. She denies LOC or head injury.   Patient is [redacted] weeks pregnant at this time; LNMP 12/23/13. She denies vaginal bleeding. She would like a check of fetal heart tones if possible.   Past Medical History  Diagnosis Date  . Medical history non-contributory    Past Surgical History  Procedure Laterality Date  . Umbilical hernia repair     Family History  Problem Relation Age of Onset  . Cancer Mother    History  Substance Use Topics  . Smoking status: Never Smoker   . Smokeless tobacco: Never Used  . Alcohol Use: No   OB History    Gravida Para Term Preterm AB TAB SAB Ectopic Multiple Living   7 1 1  0 4 0 4 0 0 1     Review of Systems  Genitourinary: Negative for vaginal bleeding.  Musculoskeletal: Positive for arthralgias.       Left knee, left ankle, and left foot pain      Allergies  Review of patient's allergies indicates no known allergies.  Home Medications   Prior to Admission medications   Medication Sig Start Date End Date Taking? Authorizing Provider  Prenatal Vit-Fe Fumarate-FA (PRENATAL MULTIVITAMIN) TABS tablet Take 1 tablet by mouth daily.    Yes Historical Provider, MD  calcium carbonate (TUMS - DOSED IN MG  ELEMENTAL CALCIUM) 500 MG chewable tablet Chew 2 tablets by mouth daily.    Historical Provider, MD   SpO2 100%  LMP 12/24/2013 Physical Exam  Constitutional: She is oriented to person, place, and time. She appears well-developed and well-nourished. No distress.  HENT:  Head: Normocephalic and atraumatic.  Nose: Nose normal.  Mouth/Throat: Oropharynx is clear and moist. No oropharyngeal exudate.  Eyes: EOM are normal. Pupils are equal, round, and reactive to light.  Neck: Normal range of motion. Neck supple.  Cardiovascular: Normal rate, regular rhythm and normal heart sounds.  Exam reveals no gallop and no friction rub.   No murmur heard. Pulmonary/Chest: Effort normal. No respiratory distress. She has no wheezes. She has no rales. She exhibits no tenderness.  Abdominal: Soft. Bowel sounds are normal. She exhibits mass. She exhibits no distension. There is no tenderness. There is no rebound and no guarding.  Musculoskeletal: Normal range of motion. She exhibits tenderness. She exhibits no edema.  Patient has mild edema to left knee.  Pain which range of motion.  No pain to lateral aspect of knee or over patella, but most pain over medial aspect of knee.  Patient with soft tissue swelling around the ankle.  Minimal tenderness.  Patient has significant tenderness to midfoot and forefoot of left  foot.  With bruising noted.  She is unable to wiggle her toes secondary to pain.  Pulses are intact, sensation is intact.  Neurological: She is alert and oriented to person, place, and time.  Skin: Skin is warm and dry. No rash noted.  Psychiatric: She has a normal mood and affect. Her behavior is normal. Judgment and thought content normal.  Nursing note and vitals reviewed.   ED Course  Procedures   DIAGNOSTIC STUDIES: Oxygen Saturation is 100% on RA, normal by my interpretation.    COORDINATION OF CARE: 2:52 AM Discussed treatment plan with pt at bedside and pt agreed to plan.   Labs  Review Labs Reviewed - No data to display  Imaging Review Dg Knee 1-2 Views Left  04/22/2014   CLINICAL DATA:  Status post fall off stool; left knee and foot pain. Initial encounter.  EXAM: LEFT KNEE - 1-2 VIEW  COMPARISON:  None.  FINDINGS: There is no evidence of fracture or dislocation. The joint spaces are preserved. No significant degenerative change is seen; the patellofemoral joint is grossly unremarkable in appearance. The lateral view is somewhat suboptimal due to patient rotation.  No significant joint effusion is seen. The visualized soft tissues are normal in appearance.  IMPRESSION: No evidence of fracture or dislocation.   Electronically Signed   By: Roanna Raider Barbara.D.   On: 04/22/2014 03:22   Dg Ankle Complete Left  04/22/2014   CLINICAL DATA:  Status post fall off stool, with left ankle pain. Initial encounter.  EXAM: LEFT ANKLE COMPLETE - 3+ VIEW  COMPARISON:  None.  FINDINGS: Fractures through the bases of the metatarsals, with associated Lisfranc injury, is better characterized on concurrent foot radiographs.  Slight cortical irregularity is suggested along the tibial plafond; this may reflect remote injury. There is also osteophyte formation along the anterior aspect of the distal tibia. The ankle mortise is intact; the interosseous space is within normal limits. The lateral view is significantly suboptimal due to limitations in positioning. A large os naviculare is noted.  The joint spaces are preserved. No significant soft tissue abnormalities are seen.  IMPRESSION: 1. Fractures through the bases of the metatarsals, with associated Lisfranc injury, is better characterized on concurrent foot radiographs. 2. Large os naviculare noted.   Electronically Signed   By: Roanna Raider Barbara.D.   On: 04/22/2014 03:29   Dg Foot Complete Left  04/22/2014   CLINICAL DATA:  Status post fall off stool, with left foot pain. Initial encounter.  EXAM: LEFT FOOT - COMPLETE 3+ VIEW  COMPARISON:  None.   FINDINGS: There are minimally displaced horizontal fractures extending across the bases of the second through fourth metatarsals, with widening between the bases of the first and second metatarsals, compatible with mild Lisfranc injury. There may also be a fracture through the base of the fifth metatarsal, though this is not well characterized. No additional fractures are seen. Visualized joint spaces are otherwise preserved.  A large os naviculare is noted. Soft tissue swelling is noted diffusely about the foot. A small plantar calcaneal spur is seen. Osteophytes are seen arising at the anterior aspect of the distal tibia.  IMPRESSION: 1. Minimally displaced horizontal fractures extending across the bases of the second through fourth metatarsals, with associated mild Lisfranc injury. Question of fracture through the base of the fifth metatarsal, though this is not well characterized. Would correlate for associated focal fifth metatarsal symptoms. 2. Large os naviculare noted.   Electronically Signed   By: Leotis Shames  Chang Barbara.D.   On: 04/22/2014 03:36     EKG Interpretation None      MDM   Final diagnoses:  Lisfranc dislocation, left, initial encounter  Normal pregnancy in multigravida in second trimester  Multiple closed fractures of metatarsal bone of left foot, initial encounter    I personally performed the services described in this documentation, which was scribed in my presence. The recorded information has been reviewed and is accurate.  34 year old female with fall from a stool with left knee, ankle and foot pain.  X-ray show a Lisfranc fracture.  Case was discussed with Dr. Carola Frost, who requests holding orders to be placed for the patient and he will plan to do surgery.  Either this morning or later on today.  He does not request labs this time as patient is a healthy 34 year old female.  Patient updated on findings and plan.  She is tearful and concerned about her pregnancy.  She is  requesting OB consult.  I will touch base with on-call OB, but I do not feel that at this time they would recommend deferring surgery.  5:26 AM Case discussed briefly with Dr. you were on call for OB.  He agrees.  The patient is safe for either general anesthesia or spinal anesthesia whenever his required to repair her fractures.  Without undue risk to her unborn child  Barbara Mackie, MD 04/22/14 [phone]

## 2014-04-22 NOTE — H&P (Signed)
[facility] H&P    Chief Complaint:  Left lisfranc fracture  HPI:   34 y/o black female, [redacted] weeks pregnant, who fell off of a stool late yesterday night. pts left foot got caught in the stool as she fell. Pt had immediate onset of pain, inability to bear weight.  Brought to [facility] and found to have multiple fractures to her Left foot as well as injury to the Lisfranc joint. Pt admitted for pain control and surgery. Denies any other injuries, denies numbness or tingling.  OB contacted by EDP, safe to proceed with surgery.   Past Medical History  Diagnosis Date  . Medical history non-contributory     Past Surgical History  Procedure Laterality Date  . Umbilical hernia repair      Family History  Problem Relation Age of Onset  . Cancer Mother    Social History:  reports that she has never smoked. She has never used smokeless tobacco. She reports that she does not drink alcohol or use illicit drugs.  Works for [employer]   Allergies: No Known Allergies  Medications Prior to Admission  Medication Sig Dispense Refill  . Prenatal Vit-Fe Fumarate-FA (PRENATAL MULTIVITAMIN) TABS tablet Take 1 tablet by mouth daily.     . calcium carbonate (TUMS - DOSED IN MG ELEMENTAL CALCIUM) 500 MG chewable tablet Chew 2 tablets by mouth daily.      No results found for this or any previous visit (from the past 48 hour(s)). Dg Knee 1-2 Views Left  04/22/2014   CLINICAL DATA:  Status post fall off stool; left knee and foot pain. Initial encounter.  EXAM: LEFT KNEE - 1-2 VIEW  COMPARISON:  None.  FINDINGS: There is no evidence of fracture or dislocation. The joint spaces are preserved. No significant degenerative change is seen; the patellofemoral joint is grossly unremarkable in appearance. The lateral view is somewhat suboptimal due to patient rotation.  No significant joint effusion is seen. The visualized soft tissues are normal in appearance.  IMPRESSION: No evidence of fracture or  dislocation.   Electronically Signed   By: Roanna Raider M.D.   On: 04/22/2014 03:22   Dg Ankle Complete Left  04/22/2014   CLINICAL DATA:  Status post fall off stool, with left ankle pain. Initial encounter.  EXAM: LEFT ANKLE COMPLETE - 3+ VIEW  COMPARISON:  None.  FINDINGS: Fractures through the bases of the metatarsals, with associated Lisfranc injury, is better characterized on concurrent foot radiographs.  Slight cortical irregularity is suggested along the tibial plafond; this may reflect remote injury. There is also osteophyte formation along the anterior aspect of the distal tibia. The ankle mortise is intact; the interosseous space is within normal limits. The lateral view is significantly suboptimal due to limitations in positioning. A large os naviculare is noted.  The joint spaces are preserved. No significant soft tissue abnormalities are seen.  IMPRESSION: 1. Fractures through the bases of the metatarsals, with associated Lisfranc injury, is better characterized on concurrent foot radiographs. 2. Large os naviculare noted.   Electronically Signed   By: Roanna Raider M.D.   On: 04/22/2014 03:29   Dg Foot Complete Left  04/22/2014   CLINICAL DATA:  Status post fall off stool, with left foot pain. Initial encounter.  EXAM: LEFT FOOT - COMPLETE 3+ VIEW  COMPARISON:  None.  FINDINGS: There are minimally displaced horizontal fractures extending across the bases of the second through fourth metatarsals, with widening between the bases of the first  and second metatarsals, compatible with mild Lisfranc injury. There may also be a fracture through the base of the fifth metatarsal, though this is not well characterized. No additional fractures are seen. Visualized joint spaces are otherwise preserved.  A large os naviculare is noted. Soft tissue swelling is noted diffusely about the foot. A small plantar calcaneal spur is seen. Osteophytes are seen arising at the anterior aspect of the distal tibia.   IMPRESSION: 1. Minimally displaced horizontal fractures extending across the bases of the second through fourth metatarsals, with associated mild Lisfranc injury. Question of fracture through the base of the fifth metatarsal, though this is not well characterized. Would correlate for associated focal fifth metatarsal symptoms. 2. Large os naviculare noted.   Electronically Signed   By: Roanna RaiderJeffery  Chang M.D.   On: 04/22/2014 03:36    Review of Systems  Constitutional: Negative for fever and chills.  Respiratory: Negative for shortness of breath and wheezing.   Cardiovascular: Negative for chest pain and palpitations.  Gastrointestinal: Negative for nausea, vomiting and abdominal pain.  Musculoskeletal:       L foot pain   Neurological: Negative for tingling and sensory change.    Blood pressure 115/62, pulse 101, temperature 97.7 F (36.5 C), temperature source Oral, resp. rate 16, last menstrual period 12/24/2013, SpO2 100 %, unknown if currently breastfeeding. Physical Exam  Constitutional: Vital signs are normal. She appears well-developed and well-nourished. She is cooperative. No distress.  Cardiovascular: Normal rate, regular rhythm, S1 normal and S2 normal.   Respiratory: Effort normal and breath sounds normal.  Musculoskeletal:  Left Lower Extremity    Bulky dressing to L foot   DPN, SPN, TN sensation grossly intact   EHL, FHL, lesser toe motor intact   Ext warm    + DP pulse     Knee w/o tenderness    Did not remove dressing to look at soft tissue   Mod swelling present   Neurological: She is alert.  Skin: Skin is warm and dry.     Assessment/Plan  34 y/o black female, [redacted] weeks pregnant, with acute L lisfranc fracture   OR for ORIF L lisfranc fracture NWB x 8 weeks  Possible dc home after surgery today vs dc home tomorrow   Mearl LatinKeith W. Orvil Faraone, PA-C [facility] [phone] (P) 04/22/2014, 12:04 PM

## 2014-04-22 NOTE — ED Notes (Signed)
The pt had her knee catch in a stool.  C/o lt knee pain and  Lt foot and ankle pain   17 weeks preg.  lmpm oct 7th

## 2014-04-22 NOTE — ED Notes (Signed)
Patient transported to X-ray 

## 2014-04-22 NOTE — Progress Notes (Signed)
Orthopedic Tech Progress Note Patient Details:  Barbara Yang 03/27/1980 [id number]  Ortho Devices Type of Ortho Device: Lenora BoysWatson Jones splint Ortho Device/Splint Interventions: Application   Haskell Flirtewsome, Glorie Dowlen M 04/22/2014, 6:11 AM

## 2014-04-23 DIAGNOSIS — Z3490 Encounter for supervision of normal pregnancy, unspecified, unspecified trimester: Secondary | ICD-10-CM

## 2014-04-23 MED ORDER — ACETAMINOPHEN 325 MG PO TABS: 325.0000 mg | ORAL_TABLET | Freq: Four times a day (QID) | ORAL | Status: DC | PRN

## 2014-04-23 MED ORDER — CHLORHEXIDINE GLUCONATE CLOTH 2 % EX PADS
6.0000 | MEDICATED_PAD | Freq: Every day | CUTANEOUS | Status: DC
  Administered 2014-04-23: 6 via TOPICAL

## 2014-04-23 MED ORDER — ACETAMINOPHEN-CODEINE 300-30 MG PO TABS: 1.0000 | ORAL_TABLET | Freq: Four times a day (QID) | ORAL | Status: DC | PRN

## 2014-04-23 MED ORDER — MUPIROCIN 2 % EX OINT: 1.0000 "application " | TOPICAL_OINTMENT | Freq: Two times a day (BID) | CUTANEOUS | Status: DC

## 2014-04-23 MED ORDER — MUPIROCIN 2 % EX OINT
1.0000 "application " | TOPICAL_OINTMENT | Freq: Two times a day (BID) | CUTANEOUS | Status: DC
  Filled 2014-04-23: qty 22

## 2014-04-23 NOTE — Evaluation (Signed)
Occupational Therapy Evaluation Patient Details Name: Barbara Yang MRN: [id number] DOB: [birth year] Today's Date: 04/23/2014    History of Present Illness  34 y/o black female, [redacted] weeks pregnant, who fell off of a stool late yesterday night. pts left foot got caught in the stool as she fell. Pt had immediate onset of pain, inability to bear weight.  Brought to [facility] and found to have multiple fractures to her Left foot as well as injury to the Lisfranc joint. s/p OR for ORIF L lisfranc fracture    Clinical Impression   Patient independent PTA. Patient currently functioning at an overall min guard > supervision level. Patient will benefit from acute OT to increase overall independence in the areas of ADLs, functional mobility, overall safety in order to safely discharge home. Patient states her husband will be present until he has to go back to work on Monday 2/08. Recommend 24/7 supervision/asistance until at least Monday. When patient home alone, recommend patient use BSC beside bed and performs showers at a supervision level with LLE covered. Patient aware of recommendations and patient and husband agreed.     Follow Up Recommendations  No OT follow up;Supervision/Assistance - 24 hour    Equipment Recommendations  3 in 1 bedside comode    Recommendations for Other Services  None at this time     Precautions / Restrictions Precautions Precautions: Fall Restrictions Weight Bearing Restrictions: Yes LLE Weight Bearing: Non weight bearing      Mobility Bed Mobility Overal bed mobility: Modified Independent             General bed mobility comments: use of bed rails  Transfers Overall transfer level: Needs assistance Equipment used: Rolling walker (2 wheeled) Transfers: Sit to/from [employer] CorporationStand;Squat Pivot Transfers Sit to Stand: Supervision Stand pivot transfers: Supervision Squat pivot transfers: Supervision     General transfer comment: Min guard needed for  functional mobility/ambulation while adhereing to NWB status    Balance Overall balance assessment: Needs assistance Sitting-balance support: Feet supported;No upper extremity supported Sitting balance-Leahy Scale: Normal     Standing balance support: Bilateral upper extremity supported;During functional activity Standing balance-Leahy Scale: Fair     ADL Overall ADL's : Needs assistance/impaired Eating/Feeding: Independent   Grooming: Supervision/safety;Standing   Upper Body Bathing: Set up;Sitting   Lower Body Bathing: Min guard;Sit to/from stand;Cueing for safety   Upper Body Dressing : Set up;Sitting   Lower Body Dressing: Min guard;Sit to/from stand;Cueing for safety   Toilet Transfer: Min guard;RW;Comfort height toilet;Ambulation   Toileting- Clothing Manipulation and Hygiene: Sit to/from stand;Cueing for safety;Supervision/safety       Functional mobility during ADLs: Min guard;Rolling walker;Cueing for safety General ADL Comments: Patient motivated to be independent. Patient's husband works during the day and her daughter is in school. Husband will be with her until Monday, recommending 24/7 supervision until then. Then recommending patient use BSC beside bed for toileting needs when he is gone. Patient able to cross RLE over for LB ADLs. Discussed importance of covering LLE during showering.     Pertinent Vitals/Pain Pain Assessment: 0-10 Pain Score: 3  Pain Location: left foot Pain Descriptors / Indicators: Dull Pain Intervention(s): Monitored during session     Hand Dominance Right   Extremity/Trunk Assessment Upper Extremity Assessment Upper Extremity Assessment: Overall WFL for tasks assessed   Lower Extremity Assessment Lower Extremity Assessment: Defer to PT evaluation   Cervical / Trunk Assessment Cervical / Trunk Assessment: Normal   Communication Communication Communication: No difficulties  Cognition Arousal/Alertness:  Awake/alert Behavior During Therapy: WFL for tasks assessed/performed Overall Cognitive Status: Within Functional Limits for tasks assessed             Home Living Family/patient expects to be discharged to:: Private residence Living Arrangements: Spouse/significant other;Children (37 yo daughter) Available Help at Discharge: Family;Available PRN/intermittently (husband works and Therapist, art in school) Type of Home: Apartment (condo) Home Access: Stairs to enter [employer] of Steps: 5 to get into building, 8 to get into [employer]: Can reach both Home Layout: One level     Bathroom Shower/Tub: Producer, television/film/video: Standard     Home Equipment: Shower seat - built in;Hand held shower head          Prior Functioning/Environment Level of Independence: Independent             OT Diagnosis: Generalized weakness;Acute pain   OT Problem List: Decreased activity tolerance;Impaired balance (sitting and/or standing);Decreased safety awareness;Decreased knowledge of use of DME or AE;Decreased knowledge of precautions;Pain   OT Treatment/Interventions: Self-care/ADL training;Therapeutic exercise;Energy conservation;DME and/or AE instruction;Therapeutic activities;Patient/family education;Balance training    OT Goals(Current goals can be found in the care plan section) Acute Rehab OT Goals Patient Stated Goal: go home OT Goal Formulation: With patient/family Time For Goal Achievement: 04/30/14 Potential to Achieve Goals: Good ADL Goals Pt Will Perform Lower Body Bathing: with modified independence;sit to/from stand;with adaptive equipment Pt Will Perform Lower Body Dressing: with modified independence;sit to/from stand;with adaptive equipment Pt Will Transfer to Toilet: with modified independence;ambulating;bedside commode Pt Will Perform Tub/Shower Transfer: Shower transfer;with modified independence;shower seat;ambulating;rolling walker   OT Frequency: Min 2X/week   Barriers to D/C: Decreased caregiver support          End of Session Equipment Utilized During Treatment: Gait belt;Rolling walker Nurse Communication: Mobility status  Activity Tolerance: Patient tolerated treatment well Patient left: in chair;with call bell/phone within reach;with family/visitor present   Time: 0920-0950 OT Time Calculation (min): 30 min Charges:  OT General Charges $OT Visit: 1 Procedure OT Evaluation $Initial OT Evaluation Tier I: 1 Procedure OT Treatments $Self Care/Home Management : 8-22 mins G-Codes: OT G-codes **NOT FOR INPATIENT CLASS** Functional Limitation: Self care Self Care Current Status (Z6109): At least 1 percent but less than 20 percent impaired, limited or restricted Self Care Goal Status (U0454): At least 1 percent but less than 20 percent impaired, limited or restricted  Rj Pedrosa , MS, OTR/L, CLT Pager: [phone]  04/23/2014, 10:10 AM

## 2014-04-23 NOTE — Progress Notes (Signed)
04/23/14 PT/OT recommended wheelchair, rolling walker and 3N1. Contacted Frank at [facility] and requested wheelchair, 3N1 and rolling walker be deivered to patient's room as soon as possible.

## 2014-04-23 NOTE — Evaluation (Signed)
Physical Therapy Evaluation Patient Details Name: Barbara Yang MRN: [id number] DOB: 1980-04-27 Today's Date: 04/23/2014   History of Present Illness   34 y/o black female, [redacted] weeks pregnant, who fell off of a stool late yesterday night. pts left foot got caught in the stool as she fell. Pt had immediate onset of pain, inability to bear weight.  Brought to [facility] and found to have multiple fractures to her Left foot as well as injury to the Lisfranc joint. s/p OR for ORIF L lisfranc fracture   Clinical Impression  Pt. Presents with a decrease in her usual level of functional mobility and gait due to lisfracn fx , s/p ORIF and now NWB in a [redacted] weeks pregnant female.  She did amazingly well with mobility though has early fatigue due to NWB status.  She will best be served with a RW and wheelchair(elevation leg rests) for home use.  She has had appropriate education and is ready for home from PT standpoint once equipment delivered.  PT will sign off.    Follow Up Recommendations No PT follow up;Supervision/Assistance - 24 hour;Supervision for mobility/OOB    Equipment Recommendations  Rolling walker with 5" wheels;Wheelchair (measurements PT);Wheelchair cushion (measurements PT)    Recommendations for Other Services       Precautions / Restrictions Precautions Precautions: Fall Precaution Comments: Pt. is [redacted] weeks pregnant Restrictions Weight Bearing Restrictions: Yes LLE Weight Bearing: Non weight bearing      Mobility  Bed Mobility Overal bed mobility: Modified Independent             General bed mobility comments: use of bed rails  Transfers Overall transfer level: Needs assistance Equipment used: Rolling walker (2 wheeled) Transfers: Sit to/from Stand Sit to Stand: Supervision         General transfer comment: supervision for safety while transferring  Ambulation/Gait Ambulation/Gait assistance: Supervision Ambulation Distance (Feet): 50 Feet Assistive  device: Rolling walker (2 wheeled) Gait Pattern/deviations: Step-to pattern (hop to due to NWb status) Gait velocity: decreased   General Gait Details: Pt. able to fully comply with NWB status.  Fatigues after 50 of NWB gait with RW.  suerpvision for safety  Stairs Stairs: Yes Stairs assistance: Min guard Stair Management: Two rails;Forwards Number of Stairs: 5 (5 steps x 2 trials) General stair comments: Pt. able to maintain NWB status and use of 2 rails to negotiate steps.  Husband Clide Cliff present and was instructed in how to assist/guard pt. safely .  Pt educated that she will likely need to take a rest after each couple of steps she completes if she tires.    Wheelchair Mobility    Modified Rankin (Stroke Patients Only)       Balance Overall balance assessment: Needs assistance Sitting-balance support: No upper extremity supported;Feet supported Sitting balance-Leahy Scale: Normal     Standing balance support: Bilateral upper extremity supported;During functional activity Standing balance-Leahy Scale: Poor Standing balance comment: rated "poor" only due to need for RW support to maintain NWB                             Pertinent Vitals/Pain Pain Assessment: 0-10 Pain Score: 7  Pain Location: left foot Pain Descriptors / Indicators: Aching Pain Intervention(s): Repositioned;Other (comment);Monitored during session;Limited activity within patient's tolerance (; pt. will call for pain meds  per her report;not yet due)    Home Living Family/patient expects to be discharged to:: Private residence Living Arrangements: Spouse/significant  other;Children Available Help at Discharge: Family;Available PRN/intermittently Type of Home: Apartment Home Access: Stairs to enter Entrance Stairs-Rails: Right;Left;Can reach both Entrance Stairs-Number of Steps: 5 to get into building, 8 to get into condo Home Layout: One level Home Equipment: Shower seat - built in;Hand held  shower head      Prior Function Level of Independence: Independent               Hand Dominance   Dominant Hand: Right    Extremity/Trunk Assessment   Upper Extremity Assessment: Overall WFL for tasks assessed           Lower Extremity Assessment: Overall WFL for tasks assessed (right LE; not tested L LE distally but WFL proximally)      Cervical / Trunk Assessment: Normal  Communication   Communication: No difficulties  Cognition Arousal/Alertness: Awake/alert Behavior During Therapy: WFL for tasks assessed/performed Overall Cognitive Status: Within Functional Limits for tasks assessed                      General Comments      Exercises        Assessment/Plan    PT Assessment Patent does not need any further PT services  PT Diagnosis Difficulty walking;Acute pain   PT Problem List    PT Treatment Interventions     PT Goals (Current goals can be found in the Care Plan section) Acute Rehab PT Goals Patient Stated Goal: home and back to school on Monday  PT Goal Formulation: All assessment and education complete, DC therapy    Frequency     Barriers to discharge        Co-evaluation               End of Session Equipment Utilized During Treatment: Gait belt (worn loosely under breasts and above pregnant abdomen) Activity Tolerance: Patient tolerated treatment well;Patient limited by fatigue Patient left: in chair;with call bell/phone within reach;with family/visitor present Nurse Communication: Mobility status    Functional Assessment Tool Used: clinical judgement and observation Functional Limitation: Mobility: Walking and moving around Mobility: Walking and Moving Around Current Status ([id number](G8978): At least 20 percent but less than 40 percent impaired, limited or restricted Mobility: Walking and Moving Around Goal Status [phone](G8979): At least 20 percent but less than 40 percent impaired, limited or restricted Mobility: Walking and  Moving Around Discharge Status [phone](G8980): At least 20 percent but less than 40 percent impaired, limited or restricted    Time: 1218-1256 PT Time Calculation (min) (ACUTE ONLY): 38 min   Charges:   PT Evaluation $Initial PT Evaluation Tier I: 1 Procedure PT Treatments $Gait Training: 23-37 mins   PT G Codes:   PT G-Codes **NOT FOR INPATIENT CLASS** Functional Assessment Tool Used: clinical judgement and observation Functional Limitation: Mobility: Walking and moving around Mobility: Walking and Moving Around Current Status ([id number](G8978): At least 20 percent but less than 40 percent impaired, limited or restricted Mobility: Walking and Moving Around Goal Status [phone](G8979): At least 20 percent but less than 40 percent impaired, limited or restricted Mobility: Walking and Moving Around Discharge Status [phone]([id number]): At least 20 percent but less than 40 percent impaired, limited or restricted    Ferman HammingBlankenship, Gery Sabedra B 04/23/2014, 3:15 PM Weldon PickingSusan Lendy Dittrich PT Acute Rehab Services [phone] [phone]

## 2014-04-23 NOTE — Progress Notes (Signed)
Fetal heart rate 159. HR done by ED RN.

## 2014-04-23 NOTE — Progress Notes (Signed)
[facility]thopaedic [facility]uma [facility] Progress Note  Subjective  Doing well Pain tolerable  Ready to go home  Block wore off in middle of night   Review of Systems  Constitutional: Negative for fever and chills.  Respiratory: Negative for shortness of breath and wheezing.   Cardiovascular: Negative for chest pain and palpitations.  Gastrointestinal: Negative for nausea, vomiting and abdominal pain.  Genitourinary: Negative for dysuria.  Neurological: Negative for tingling and sensory change.     Objective   BP 110/59 mmHg  Pulse 92  Temp(Src) 98.4 F (36.9 C) (Oral)  Resp 16  SpO2 100%  LMP 12/24/2013  Intake/Output      02/04 0701 - 02/05 0700 02/05 0701 - 02/06 0700   P.O. 720    I.V. 700    Total Intake 1420     Net +1420          Urine Occurrence 2 x       Exam  Gen: awake and alert, NAD Lungs: clear B  Cardiac: RRR, s1 and s2 Ext:       Left Lower Extremity   Splint fitting well  Extremity is warm  Motor and sensory functions grossly  Swelling is stable   Assessment and Plan   POD/HD#: 1  1. Left Lisfranc fracture dislocation status post ORIF  Nonweightbearing for 8 weeks  Splint for the next 2 weeks with conversion to cast  Ice and elevate  Toe motion as tolerated  2. Pain management:  Tylenol and Norco for severe pain  3. ABL anemia/Hemodynamics  Stable  4. [redacted] weeks pregnant  Patient is stable during hospitalization   5. DVT/PE prophylaxis:  No pharmacologic DVT prophylaxis  6. ID:   Completed perioperative antibiotics  7. Activity:  As tolerated while maintaining nonweightbearing restrictions  8. Dispo:  Discharged today with follow-up in 2 weeks    Barbara LatinKeith W. Saarah Dewing, PA-C [facility] [phone](P) [phone] (O) 04/23/2014 10:53 AM

## 2014-04-23 NOTE — Brief Op Note (Signed)
04/22/14  1. Repair of Lisfranc Joint, 2nd MTT frx, percutaneous 2. Stress flouro 3. Closed treatment of 3rd and 4th MTT  Barbara FlesherM Borghild Thaker, MD Oletha BlendK Paul, [facility]

## 2014-04-23 NOTE — Discharge Summary (Signed)
Orthopaedic Trauma Service (OTS)  Patient ID: Barbara Yang MRN: [id number] DOB/AGE: [birth year] 33 y.o.  Admit date: 04/22/2014 Discharge date: 04/23/2014  Admission Diagnoses: Left Lisfranc fracture dislocation Pregnancy: 17 weeks  Discharge Diagnoses:  Active Problems:   Lisfranc dislocation   Pregnancy   Procedures Performed: 04/22/2014- Dr. Carola Frost 1. ORIF left Lisfranc fracture   Discharged Condition: good  Hospital Course:    Consults: None  Significant Diagnostic Studies:  none  Treatments: IV hydration, antibiotics: Ancef, analgesia: acetaminophen, Morphine and Norco, therapies: PT and RN and surgery: As above  Discharge Exam:  Orthopaedic Trauma Service Progress Note  Subjective  Doing well Pain tolerable   Ready to go home   Block wore off in middle of night   Review of Systems  Constitutional: Negative for fever and chills.  Respiratory: Negative for shortness of breath and wheezing.   Cardiovascular: Negative for chest pain and palpitations.  Gastrointestinal: Negative for nausea, vomiting and abdominal pain.  Genitourinary: Negative for dysuria.  Neurological: Negative for tingling and sensory change.     Objective   BP 110/59 mmHg  Pulse 92  Temp(Src) 98.4 F (36.9 C) (Oral)  Resp 16  SpO2 100%  LMP 12/24/2013  Intake/Output       02/04 0701 - 02/05 0700 02/05 0701 - 02/06 0700    P.O. 720     I.V. 700     Total Intake 1420      Net +1420            Urine Occurrence 2 x        Exam  Gen: awake and alert, NAD Lungs: clear B   Cardiac: RRR, s1 and s2 Ext:        Left Lower Extremity               Splint fitting well             Extremity is warm             Motor and sensory functions grossly             Swelling is stable   Assessment and Plan   POD/HD#: 1  1. Left Lisfranc fracture dislocation status post ORIF             Nonweightbearing for 8 weeks             Splint for the next 2 weeks with conversion  to cast             Ice and elevate             Toe motion as tolerated  2. Pain management:             Tylenol and Norco for severe pain  3. ABL anemia/Hemodynamics             Stable  4. [redacted] weeks pregnant             Patient is stable during hospitalization   5. DVT/PE prophylaxis:             No pharmacologic DVT prophylaxis  6. ID:               Completed perioperative antibiotics  7. Activity:             As tolerated while maintaining nonweightbearing restrictions  8. Dispo:             Discharged today with follow-up in 2  weeks    Barbara LatinKeith W. Deen Deguia, PA-C [facility] [phone](P) [phone] (O) 04/23/2014 10:53 AM      Disposition: 01-Home or Self Care      Discharge Instructions    Call MD / Call 911    Complete by:  As directed   If you experience chest pain or shortness of breath, CALL 911 and be transported to the hospital emergency room.  If you develope a fever above 101 F, pus (white drainage) or increased drainage or redness at the wound, or calf pain, call your surgeon's office.     Constipation Prevention    Complete by:  As directed   Drink plenty of fluids.  Prune juice may be helpful.  You may use a stool softener, such as Colace (over the counter) 100 mg twice a day.  Use MiraLax (over the counter) for constipation as needed.     Diet general    Complete by:  As directed      Discharge instructions    Complete by:  As directed   [facility] Discharge Instructions,   General Discharge Instructions  WEIGHT BEARING STATUS: nonweightbearing left leg  RANGE OF MOTION/ACTIVITY: ok to move knee and hip as tolerated. Do not remove splint   Diet: as you were eating previously.  Can use over the counter stool softeners and bowel preparations, such as Miralax, to help with bowel movements.  Narcotics can be constipating.  Be sure to drink plenty of fluids  STOP SMOKING OR USING NICOTINE PRODUCTS!!!!  As discussed  nicotine severely impairs your body's ability to heal surgical and traumatic wounds but also impairs bone healing.  Wounds and bone heal by forming microscopic blood vessels (angiogenesis) and nicotine is a vasoconstrictor (essentially, shrinks blood vessels).  Therefore, if vasoconstriction occurs to these microscopic blood vessels they essentially disappear and are unable to deliver necessary nutrients to the healing tissue.  This is one modifiable factor that you can do to dramatically increase your chances of healing your injury.    (This means no smoking, no nicotine gum, patches, etc)  DO NOT USE NONSTEROIDAL ANTI-INFLAMMATORY DRUGS (NSAID'S)  Using products such as Advil (ibuprofen), Aleve (naproxen), Motrin (ibuprofen) for additional pain control during fracture healing can delay and/or prevent the healing response.  If you would like to take over the counter (OTC) medication, Tylenol (acetaminophen) is ok.  However, some narcotic medications that are given for pain control contain acetaminophen as well. Therefore, you should not exceed more than 4000 mg of tylenol in a day if you do not have liver disease.  Also note that there are may OTC medicines, such as cold medicines and allergy medicines that my contain tylenol as well.  If you have any questions about medications and/or interactions please ask your doctor/PA or your pharmacist.   PAIN MEDICATION USE AND EXPECTATIONS  You have likely been given narcotic medications to help control your pain.  After a traumatic event that results in an fracture (broken bone) with or without surgery, it is ok to use narcotic pain medications to help control one's pain.  We understand that everyone responds to pain differently and each individual patient will be evaluated on a regular basis for the continued need for narcotic medications. Ideally, narcotic medication use should last no more than 6-8 weeks (coinciding with fracture healing).   As a patient it is  your responsibility as well to monitor narcotic medication use and report the amount and frequency you use  these medications when you come to your office visit.   We would also advise that if you are using narcotic medications, you should take a dose prior to therapy to maximize you participation.  IF YOU ARE ON NARCOTIC MEDICATIONS IT IS NOT PERMISSIBLE TO OPERATE A MOTOR VEHICLE (MOTORCYCLE/CAR/TRUCK/MOPED) OR HEAVY MACHINERY DO NOT MIX NARCOTICS WITH OTHER CNS (CENTRAL NERVOUS SYSTEM) DEPRESSANTS SUCH AS ALCOHOL       ICE AND ELEVATE INJURED/OPERATIVE EXTREMITY  Using ice and elevating the injured extremity above your heart can help with swelling and pain control.  Icing in a pulsatile fashion, such as 20 minutes on and 20 minutes off, can be followed.    Do not place ice directly on skin. Make sure there is a barrier between to skin and the ice pack.    Using frozen items such as frozen peas works well as the conform nicely to the are that needs to be iced.  USE AN ACE WRAP OR TED HOSE FOR SWELLING CONTROL  In addition to icing and elevation, Ace wraps or TED hose are used to help limit and resolve swelling.  It is recommended to use Ace wraps or TED hose until you are informed to stop.    When using Ace Wraps start the wrapping distally (farthest away from the body) and wrap proximally (closer to the body)   Example: If you had surgery on your leg or thing and you do not have a splint on, start the ace wrap at the toes and work your way up to the thigh        If you had surgery on your upper extremity and do not have a splint on, start the ace wrap at your fingers and work your way up to the upper arm  IF YOU ARE IN A SPLINT OR CAST DO NOT REMOVE IT FOR ANY REASON   If your splint gets wet for any reason please contact the office immediately. You may shower in your splint or cast as long as you keep it dry.  This can be done by wrapping in a cast cover or garbage back (or similar)  Do Not  stick any thing down your splint or cast such as pencils, money, or hangers to try and scratch yourself with.  If you feel itchy take benadryl as prescribed on the bottle for itching  IF YOU ARE IN A CAM BOOT (BLACK BOOT)  You may remove boot periodically. Perform daily dressing changes as noted below.  Wash the liner of the boot regularly and wear a sock when wearing the boot. It is recommended that you sleep in the boot until told otherwise  CALL THE OFFICE WITH ANY QUESTIONS OR CONCERTS: [phone]     Driving restrictions    Complete by:  As directed   No driving for     Increase activity slowly as tolerated    Complete by:  As directed      Non weight bearing    Complete by:  As directed   Laterality:  left  Extremity:  Lower            Medication List    TAKE these medications        acetaminophen 325 MG tablet  Commonly known as:  TYLENOL  Take 1-2 tablets (325-650 mg total) by mouth every 6 (six) hours as needed for moderate pain or fever.     Acetaminophen-Codeine 300-30 MG per tablet  Take 1-2 tablets by mouth every  6 (six) hours as needed for pain.     calcium carbonate 500 MG chewable tablet  Commonly known as:  TUMS - dosed in mg elemental calcium  Chew 2 tablets by mouth daily.     mupirocin ointment 2 %  Commonly known as:  BACTROBAN  Place 1 application into the nose 2 (two) times daily.     prenatal multivitamin Tabs tablet  Take 1 tablet by mouth daily.       Follow-up Information    Follow up with HANDY,MICHAEL H, MD In 2 weeks.   Specialty:  Orthopedic Surgery   Why:  For wound re-check, For suture removal   Contact information:   [address] [place] [address] [phone]       Discharge Instructions and Plan: 1. Left Lisfranc fracture dislocation status post ORIF             Nonweightbearing for 8 weeks             Splint for the next 2 weeks with conversion to cast             Ice and elevate             Toe  motion as tolerated  2. Pain management:             Tylenol   Patient states to her disease and has now decided on Tylenol 3 instead of Norco  3. DVT/PE prophylaxis:             No pharmacologic DVT prophylaxis  4. Activity:             As tolerated while maintaining nonweightbearing restrictions  5. Dispo:             Discharged today with follow-up in 2 weeks  Follow-up with OB at regularly scheduled interval  Signed:  Mearl Latin, PA-C [facility] [phone] (P) 04/23/2014, 11:03 AM

## 2014-04-23 NOTE — Discharge Instructions (Signed)
[facility] Discharge Instructions,   General Discharge Instructions  WEIGHT BEARING STATUS: nonweightbearing left leg  RANGE OF MOTION/ACTIVITY: ok to move knee and hip as tolerated. Do not remove splint   Diet: as you were eating previously.  Can use over the counter stool softeners and bowel preparations, such as Miralax, to help with bowel movements.  Narcotics can be constipating.  Be sure to drink plenty of fluids  STOP SMOKING OR USING NICOTINE PRODUCTS!!!!  As discussed nicotine severely impairs your body's ability to heal surgical and traumatic wounds but also impairs bone healing.  Wounds and bone heal by forming microscopic blood vessels (angiogenesis) and nicotine is a vasoconstrictor (essentially, shrinks blood vessels).  Therefore, if vasoconstriction occurs to these microscopic blood vessels they essentially disappear and are unable to deliver necessary nutrients to the healing tissue.  This is one modifiable factor that you can do to dramatically increase your chances of healing your injury.    (This means no smoking, no nicotine gum, patches, etc)  DO NOT USE NONSTEROIDAL ANTI-INFLAMMATORY DRUGS (NSAID'S)  Using products such as Advil (ibuprofen), Aleve (naproxen), Motrin (ibuprofen) for additional pain control during fracture healing can delay and/or prevent the healing response.  If you would like to take over the counter (OTC) medication, Tylenol (acetaminophen) is ok.  However, some narcotic medications that are given for pain control contain acetaminophen as well. Therefore, you should not exceed more than 4000 mg of tylenol in a day if you do not have liver disease.  Also note that there are may OTC medicines, such as cold medicines and allergy medicines that my contain tylenol as well.  If you have any questions about medications and/or interactions please ask your doctor/PA or your pharmacist.   PAIN MEDICATION USE AND EXPECTATIONS  You have likely been  given narcotic medications to help control your pain.  After a traumatic event that results in an fracture (broken bone) with or without surgery, it is ok to use narcotic pain medications to help control one's pain.  We understand that everyone responds to pain differently and each individual patient will be evaluated on a regular basis for the continued need for narcotic medications. Ideally, narcotic medication use should last no more than 6-8 weeks (coinciding with fracture healing).   As a patient it is your responsibility as well to monitor narcotic medication use and report the amount and frequency you use these medications when you come to your office visit.   We would also advise that if you are using narcotic medications, you should take a dose prior to therapy to maximize you participation.  IF YOU ARE ON NARCOTIC MEDICATIONS IT IS NOT PERMISSIBLE TO OPERATE A MOTOR VEHICLE (MOTORCYCLE/CAR/TRUCK/MOPED) OR HEAVY MACHINERY DO NOT MIX NARCOTICS WITH OTHER CNS (CENTRAL NERVOUS SYSTEM) DEPRESSANTS SUCH AS ALCOHOL       ICE AND ELEVATE INJURED/OPERATIVE EXTREMITY  Using ice and elevating the injured extremity above your heart can help with swelling and pain control.  Icing in a pulsatile fashion, such as 20 minutes on and 20 minutes off, can be followed.    Do not place ice directly on skin. Make sure there is a barrier between to skin and the ice pack.    Using frozen items such as frozen peas works well as the conform nicely to the are that needs to be iced.  USE AN ACE WRAP OR TED HOSE FOR SWELLING CONTROL  In addition to icing and elevation, Ace wraps or TED hose  are used to help limit and resolve swelling.  It is recommended to use Ace wraps or TED hose until you are informed to stop.    When using Ace Wraps start the wrapping distally (farthest away from the body) and wrap proximally (closer to the body)   Example: If you had surgery on your leg or thing and you do not have a splint on,  start the ace wrap at the toes and work your way up to the thigh        If you had surgery on your upper extremity and do not have a splint on, start the ace wrap at your fingers and work your way up to the upper arm  IF YOU ARE IN A SPLINT OR CAST DO NOT REMOVE IT FOR ANY REASON   If your splint gets wet for any reason please contact the office immediately. You may shower in your splint or cast as long as you keep it dry.  This can be done by wrapping in a cast cover or garbage back (or similar)  Do Not stick any thing down your splint or cast such as pencils, money, or hangers to try and scratch yourself with.  If you feel itchy take benadryl as prescribed on the bottle for itching  IF YOU ARE IN A CAM BOOT (BLACK BOOT)  You may remove boot periodically. Perform daily dressing changes as noted below.  Wash the liner of the boot regularly and wear a sock when wearing the boot. It is recommended that you sleep in the boot until told otherwise  CALL THE OFFICE WITH ANY QUESTIONS OR CONCERTS: [phone]

## 2014-04-24 NOTE — Op Note (Signed)
NAMDoreene Yang:  Yang, Barbara             ACCOUNT NO.:  [id number]  MEDICAL RECORD NO.:  [id number]  LOCATION:  5N27C                        FACILITY:  [facility]  PHYSICIAN:  Doralee AlbinoMichael H. Carola FrostHandy, M.D. DATE OF BIRTH:  January 30, 1981  DATE OF PROCEDURE:  04/23/2014 DATE OF DISCHARGE:  04/23/2014                              OPERATIVE REPORT   PREOPERATIVE DIAGNOSIS:  Left foot Lisfranc injury with fractures of the second, third, and fourth metatarsal bases.  POSTOPERATIVE DIAGNOSIS:   1. Left foot Lisfranc injury with fractures of the second, third, and fourth metatarsal bases. 2. Talonavicular sprain  PROCEDURES:   1. Percutaneous fixation of the left Lisfranc joint, 2nd Metatarsal tarsal joint. 2. 3rd metatarsal fracture without manipulation 3. Closed treatment of 4th metarsal fracture without manipulation 4. Stress view flouro foot  SURGEON:  Doralee AlbinoMichael H. Carola FrostHandy, M.D.  ASSISTANT:  Mearl LatinKeith W Paul, PA-C.  ANESTHESIA:  General.  COMPLICATIONS:  None.  TOURNIQUET:  None.  DISPOSITION:  To PACU.  CONDITION:  Stable.  BRIEF SUMMARY AND INDICATIONS FOR PROCEDURE:  Barbara Yang is a very pleasant 34 year old female with an acute left foot injury when falling off a stool.  The patient had severe foot pain, inability to bear weight.  Initial plain films demonstrated slight displacement through the Lisfranc joint with metatarsal base fractures.  CT scan confirmed these findings.  I discussed with the patient the risks and benefits of surgical versus nonsurgical management.  We also discussed the possibility of anesthetic risk to her baby in utero.  She also discussed these risks directly with the anesthesiologist.  Risk addressed included injury to development of her unborn, malunion, nonunion, failure to prevent a collapse in the event of noncompliance with weightbearing restrictions, the need for subsequent hardware removal or breakage.  We also discussed infection, nerve injury, and vessel  injury.  She did wish to proceed with stabilization of her Lisfranc joint.  BRIEF SUMMARY OF PROCEDURE:  Barbara Yang was given a block preoperatively.  Taken to the operating room where general anesthesia was induced.  There was fetal monitoring.  The patient was encased around the abdomen and thighs in lead apron to ensure protection of her unborn baby.  C-arm was brought in and an appropriate starting trajectory for a Lisfranc screw identified.  A small stab incision was made and the guide pin advanced to the appropriate portion of the second metatarsal.  This was angled directly across into the medial cuneiform. I then selected the long thread partially cannulated 4-0 screw from [employer] and after drilling, placed the screw reducing the metatarsal and ensuring that the articular surface remained congruent with the cuneiform.  This was checked on multiple oblique images as well as an AP and lateral showing appropriate position and trajectory.  The wound was irrigated and closed with 3-0 nylon.  Sterile gently compressive dressing was applied and then a posterior stirrup splint.  It should also be noted that we did identify some excessive flexion of the navicular over the head of the talus consistent with sprain at that joint as well.  It did reduce appropriately with extension of the foot and ankle.  No other foot abnormalities were identified on general stress fluoroscopy of  the foot.  Montez Morita, PA-C did assist me throughout.  PROGNOSIS:  Barbara Yang will be in the current splint for the next 2-3 weeks returning for cast application.  One screw should maintain reduction if the patient can be compliant and I would expect the capsular injury to the talonavicular joint to heal conservatively without complication.  I would anticipate beginning touchdown weightbearing at 6 weeks though we may delay this to 8 weeks depending upon her physical examination and x-rays.  She will not have  unprotected weightbearing until at least 8 weeks.     Doralee Albino. Carola Frost, M.D.     [facility]/MEDQ  D:  04/23/2014  T:  04/24/2014  Job:  [id number]

## 2014-04-26 ENCOUNTER — Encounter (HOSPITAL_COMMUNITY): Payer: Self-pay | Admitting: Orthopedic Surgery

## 2014-04-27 NOTE — Anesthesia Postprocedure Evaluation (Signed)
  Anesthesia Post-op Note  Patient: Barbara Yang  Procedure(s) Performed: Procedure(s) (LRB): OPEN REDUCTION INTERNAL FIXATION (ORIF) LEFT LISFRANC FRACTURE (Left)  Patient Location: PACU  Anesthesia Type: General  Level of Consciousness: awake and alert   Airway and Oxygen Therapy: Patient Spontanous Breathing  Post-op Pain: mild  Post-op Assessment: Post-op Vital signs reviewed, Patient's Cardiovascular Status Stable, Respiratory Function Stable, Patent Airway and No signs of Nausea or vomiting  Last Vitals:  Filed Vitals:   04/23/14 1425  BP: 105/62  Pulse: 88  Temp: 36.8 C  Resp: 17    Post-op Vital Signs: stable   Complications: No apparent anesthesia complications

## 2014-04-30 ENCOUNTER — Encounter (HOSPITAL_COMMUNITY): Payer: Self-pay | Admitting: Obstetrics

## 2014-04-30 LAB — US OB DETAIL + 14 WK

## 2014-05-05 ENCOUNTER — Other Ambulatory Visit (HOSPITAL_COMMUNITY): Payer: Self-pay | Admitting: Obstetrics

## 2014-05-05 DIAGNOSIS — O09522 Supervision of elderly multigravida, second trimester: Secondary | ICD-10-CM

## 2014-05-12 ENCOUNTER — Ambulatory Visit (HOSPITAL_COMMUNITY)
Admission: RE | Admit: 2014-05-12 | Discharge: 2014-05-12 | Disposition: A | Discharge status: Home / Self Care | Payer: BLUE CROSS/BLUE SHIELD | Source: Ambulatory Visit | Attending: Obstetrics | Admitting: Obstetrics

## 2014-05-12 ENCOUNTER — Other Ambulatory Visit (HOSPITAL_COMMUNITY): Payer: Self-pay | Admitting: Obstetrics and Gynecology

## 2014-05-12 ENCOUNTER — Other Ambulatory Visit (HOSPITAL_COMMUNITY): Payer: Self-pay

## 2014-05-12 ENCOUNTER — Encounter (HOSPITAL_COMMUNITY): Payer: Self-pay

## 2014-05-12 DIAGNOSIS — Z3689 Encounter for other specified antenatal screening: Secondary | ICD-10-CM | POA: Insufficient documentation

## 2014-05-12 DIAGNOSIS — Z3A19 19 weeks gestation of pregnancy: Secondary | ICD-10-CM | POA: Insufficient documentation

## 2014-05-12 DIAGNOSIS — O280 Abnormal hematological finding on antenatal screening of mother: Secondary | ICD-10-CM | POA: Insufficient documentation

## 2014-05-12 DIAGNOSIS — O09522 Supervision of elderly multigravida, second trimester: Secondary | ICD-10-CM

## 2014-05-12 DIAGNOSIS — O09529 Supervision of elderly multigravida, unspecified trimester: Secondary | ICD-10-CM | POA: Insufficient documentation

## 2014-05-12 DIAGNOSIS — O0992 Supervision of high risk pregnancy, unspecified, second trimester: Secondary | ICD-10-CM

## 2014-05-12 DIAGNOSIS — Z315 Encounter for genetic counseling: Secondary | ICD-10-CM | POA: Diagnosis not present

## 2014-05-12 DIAGNOSIS — O351XX0 Maternal care for (suspected) chromosomal abnormality in fetus, not applicable or unspecified: Secondary | ICD-10-CM | POA: Insufficient documentation

## 2014-05-12 DIAGNOSIS — Z3482 Encounter for supervision of other normal pregnancy, second trimester: Secondary | ICD-10-CM

## 2014-05-12 DIAGNOSIS — S8990XA Unspecified injury of unspecified lower leg, initial encounter: Secondary | ICD-10-CM

## 2014-05-12 DIAGNOSIS — S93325A Dislocation of tarsometatarsal joint of left foot, initial encounter: Secondary | ICD-10-CM

## 2014-05-12 MED ORDER — ENOXAPARIN SODIUM 40 MG/0.4ML ~~LOC~~ SOLN: 40.0000 mg | SUBCUTANEOUS | Status: DC

## 2014-05-12 NOTE — Progress Notes (Signed)
DOB: 1980-11-02 Referring Provider: Kathreen Yang, Barbara Yang, Barbara Yang Appointment Date: 05/12/2014 Attending: Dr. Derinda SisJeff Denney  Ms. Barbara Yang was seen for genetic counseling regarding Yang maternal age of 34 y.o. and an increased risk for Down syndrome based on Yang maternal serum Quad screen.  She was accompanied to the visit by her friend, Barbara Yang.  She was counseled regarding maternal age and the association with risk for chromosome conditions due to nondisjunction.   We reviewed chromosomes, nondisjunction, and the associated 1 in 141 risk for fetal aneuploidy related to Yang maternal age of 34 y.o. at 3939w6d gestation.  She was counseled that the risk for aneuploidy decreases as gestational age increases, accounting for those pregnancies which spontaneously abort.  We specifically discussed Down syndrome (trisomy 4721), trisomies 2413 and 2818, and sex chromosome aneuploidies (47,XXX and 47,XXY) including the common features and prognoses of each.   We also reviewed Barbara Yang's maternal serum Quad screen result and the associated increase in risk for fetal Down syndrome to 1 in 83.  She was counseled regarding other explanations for Yang screen positive result including normal variation and differences in maternal metabolism.  In addition, we reviewed the reduction in risks for trisomy 4818 and ONTDs based on the Quad screen.  Barbara Yang had Yang targeted ultrasound today, that report will be documented separately.  We discussed that approximately 50-80% of fetuses with Down syndrome and up to 90-95% of fetuses with trisomy 18/13, when well visualized, have detectable anomalies or soft markers by detailed ultrasound.  Barbara Yang's ultrasound did not identify any fetal anomalies or soft markers of aneuploidy.  Thus, the chance for Down syndrome can be reduced.  She understands that Quad screening and ultrasound provide Yang pregnancy specific risk for Down syndrome, but are not considered to be diagnostic.    We reviewed the  additional screening option of noninvasive prenatal screening (NIPS)/cell free DNA (cfDNA) testing.  We reviewed the benefits and limitations of this option. Specifically, we discussed the conditions for which the test screens, the detection rates, and false positive rates for each. She was also counseled regarding diagnostic testing via amniocentesis. We reviewed the approximate 1 in 300-500 risk for complications from amniocentesis, including spontaneous pregnancy loss.  After consideration of all the options she declined additional testing or screening at this time.  Barbara Yang was provided with written information regarding sickle cell anemia (SCA) including the carrier frequency and incidence in the African-American population, the availability of carrier testing and prenatal diagnosis if indicated.  In addition, we discussed that hemoglobinopathies are routinely screened for as part of the [place] newborn screening panel.  She was counseled that testing for sickle cell anemia was performed at her referring physician's office and reported to be normal.  Both family histories were reviewed and found to be noncontributory for birth defects, intellectual disability, and known genetic conditions. Without further information regarding the provided family history, an accurate genetic risk cannot be calculated. Further genetic counseling is warranted if more information is obtained.  Barbara Yang denied exposure to environmental toxins or chemical agents. She denied the use of alcohol, tobacco or street drugs. She denied significant viral illnesses during the course of her pregnancy. Her medical and surgical histories were contributory for Yang recent fracture and surgical repair.  Please see Barbara Yang note for discussion of this condition.     I counseled Barbara Yang regarding the above risks and available options.  The approximate face-to-face time with the genetic counselor was 45 minutes.  Barbara Gemma, MS,   Certified Genetic Counselor

## 2014-05-12 NOTE — Progress Notes (Signed)
MFM Consultation, Brief Staff Note  Impressions:  Patient has an immobilized left lower extremity due to lisfranc dislocation and pregnant conferring increased risk for DVT/VTE Patient Active Problem List   Diagnosis Date Noted  . AMA (advanced maternal age) multigravida 35+   . [redacted] weeks gestation of pregnancy   . Encounter for fetal anatomic survey   . Pregnancy 04/23/2014  . Lisfranc dislocation 04/22/2014    Recommendations:  1. Any surgical procedural intervention desired/recommended by orthopedic surgery should not be delayed due to pregnancy. 2. I have prescribed LMWH 40mg  [place] q24 hours for DVT prophylaxis.  Given that I have only seen this patient in consultation, I will defer the continuation of prescription beyond the next 30 days to one of the physicians seeing her in follow up (eg, Drs. Gaynell FaceMarshall of OB or DaneHandy of Orthopedic surgery).  My recommendation is that as long as she has a form of immobilization on this injured leg whether splint or cast that she continue the prophylaxis against DVT with LMWH (note:  I will comment that patient was concerned about fetal risks and I reassured her that LMWH does not cross the placenta).  Once her cast/splint immobilization is removed and she is fully ambulatory, she will no longer need the LMWH.   3. Percocet or similar analgesics are also safe in the short term for pain relief (little to no risk of maternal-fetal dependency).  Time Spent: I spent in excess of 20 minutes in consultation with this patient to review records, evaluate her case, and provide her with an adequate discussion and education.  More than 50% of this time was spent in direct face-to-face counseling. It was a pleasure seeing your patient in the office today.  Thank you for consultation. Please do not hesitate to contact our service for any further questions.    Thank you,  Louann SjogrenJeffrey Morgan Gaynelle Arabianenney   Denney, Louann SjogrenJeffrey Morgan, MD, MS, FACOG Assistant Professor Section of  Maternal-Fetal Medicine [facility]

## 2014-05-12 NOTE — Addendum Note (Signed)
Encounter addended by: Heidi DachMelanie A Shariece Viveiros, RN on: 05/12/2014  1:51 PM<BR>     Documentation filed: Notes Section

## 2014-05-12 NOTE — Addendum Note (Signed)
Encounter addended by: Azalia Bilisaragh M Mearle Drew on: 05/12/2014 12:56 PM<BR>     Documentation filed: Episodes, Notes Section, Problem List, Visit Diagnoses, Charges VN, Letters

## 2014-05-12 NOTE — ED Notes (Signed)
Lovenox injection teaching and education materials reviewed and given to pt.

## 2014-06-18 ENCOUNTER — Ambulatory Visit (HOSPITAL_COMMUNITY)
Admission: RE | Admit: 2014-06-18 | Discharge: 2014-06-18 | Disposition: A | Discharge status: Home / Self Care | Payer: BLUE CROSS/BLUE SHIELD | Source: Ambulatory Visit | Attending: Obstetrics | Admitting: Obstetrics

## 2014-06-18 ENCOUNTER — Other Ambulatory Visit (HOSPITAL_COMMUNITY): Payer: Self-pay | Admitting: Obstetrics and Gynecology

## 2014-06-18 ENCOUNTER — Encounter (HOSPITAL_COMMUNITY): Payer: Self-pay

## 2014-06-18 ENCOUNTER — Other Ambulatory Visit (HOSPITAL_COMMUNITY): Payer: Self-pay | Admitting: Maternal and Fetal Medicine

## 2014-06-18 DIAGNOSIS — O09522 Supervision of elderly multigravida, second trimester: Secondary | ICD-10-CM | POA: Diagnosis not present

## 2014-06-18 DIAGNOSIS — O99212 Obesity complicating pregnancy, second trimester: Secondary | ICD-10-CM | POA: Diagnosis not present

## 2014-06-18 DIAGNOSIS — O283 Abnormal ultrasonic finding on antenatal screening of mother: Secondary | ICD-10-CM | POA: Diagnosis not present

## 2014-06-18 DIAGNOSIS — Z36 Encounter for antenatal screening of mother: Secondary | ICD-10-CM | POA: Diagnosis not present

## 2014-06-18 DIAGNOSIS — IMO0002 Reserved for concepts with insufficient information to code with codable children: Secondary | ICD-10-CM | POA: Insufficient documentation

## 2014-06-18 DIAGNOSIS — O99210 Obesity complicating pregnancy, unspecified trimester: Secondary | ICD-10-CM | POA: Insufficient documentation

## 2014-06-18 DIAGNOSIS — Z3A25 25 weeks gestation of pregnancy: Secondary | ICD-10-CM | POA: Insufficient documentation

## 2014-06-18 DIAGNOSIS — Z0489 Encounter for examination and observation for other specified reasons: Secondary | ICD-10-CM | POA: Insufficient documentation

## 2014-06-18 DIAGNOSIS — O285 Abnormal chromosomal and genetic finding on antenatal screening of mother: Secondary | ICD-10-CM | POA: Insufficient documentation

## 2014-08-06 ENCOUNTER — Other Ambulatory Visit (HOSPITAL_COMMUNITY): Payer: Self-pay | Admitting: Maternal and Fetal Medicine

## 2014-08-06 ENCOUNTER — Ambulatory Visit (HOSPITAL_COMMUNITY)
Admission: RE | Admit: 2014-08-06 | Discharge: 2014-08-06 | Disposition: A | Discharge status: Home / Self Care | Payer: BLUE CROSS/BLUE SHIELD | Source: Ambulatory Visit | Attending: Obstetrics | Admitting: Obstetrics

## 2014-08-06 DIAGNOSIS — O09522 Supervision of elderly multigravida, second trimester: Secondary | ICD-10-CM

## 2014-08-06 DIAGNOSIS — O99213 Obesity complicating pregnancy, third trimester: Secondary | ICD-10-CM

## 2014-08-06 DIAGNOSIS — Z36 Encounter for antenatal screening of mother: Secondary | ICD-10-CM | POA: Diagnosis not present

## 2014-08-06 DIAGNOSIS — O283 Abnormal ultrasonic finding on antenatal screening of mother: Secondary | ICD-10-CM | POA: Insufficient documentation

## 2014-08-06 DIAGNOSIS — O289 Unspecified abnormal findings on antenatal screening of mother: Secondary | ICD-10-CM

## 2014-08-06 DIAGNOSIS — E669 Obesity, unspecified: Secondary | ICD-10-CM | POA: Diagnosis not present

## 2014-08-20 ENCOUNTER — Encounter (HOSPITAL_COMMUNITY): Payer: Self-pay

## 2014-08-20 ENCOUNTER — Other Ambulatory Visit (HOSPITAL_COMMUNITY): Payer: Self-pay | Admitting: Maternal and Fetal Medicine

## 2014-08-20 ENCOUNTER — Ambulatory Visit (HOSPITAL_COMMUNITY)
Admission: RE | Admit: 2014-08-20 | Discharge: 2014-08-20 | Disposition: A | Discharge status: Home / Self Care | Payer: BLUE CROSS/BLUE SHIELD | Source: Ambulatory Visit | Attending: Obstetrics | Admitting: Obstetrics

## 2014-08-20 DIAGNOSIS — O289 Unspecified abnormal findings on antenatal screening of mother: Secondary | ICD-10-CM

## 2014-08-20 DIAGNOSIS — O09523 Supervision of elderly multigravida, third trimester: Secondary | ICD-10-CM | POA: Insufficient documentation

## 2014-08-20 DIAGNOSIS — O09522 Supervision of elderly multigravida, second trimester: Secondary | ICD-10-CM

## 2014-08-20 DIAGNOSIS — O4103X0 Oligohydramnios, third trimester, not applicable or unspecified: Secondary | ICD-10-CM | POA: Diagnosis present

## 2014-08-20 DIAGNOSIS — Z3A34 34 weeks gestation of pregnancy: Secondary | ICD-10-CM | POA: Diagnosis not present

## 2014-08-20 DIAGNOSIS — O99213 Obesity complicating pregnancy, third trimester: Secondary | ICD-10-CM

## 2014-08-20 DIAGNOSIS — O283 Abnormal ultrasonic finding on antenatal screening of mother: Secondary | ICD-10-CM | POA: Diagnosis not present

## 2014-08-24 LAB — OB RESULTS CONSOLE GBS: GBS: NEGATIVE

## 2014-09-03 ENCOUNTER — Ambulatory Visit (HOSPITAL_COMMUNITY)
Admission: RE | Admit: 2014-09-03 | Discharge: 2014-09-03 | Disposition: A | Discharge status: Home / Self Care | Payer: BLUE CROSS/BLUE SHIELD | Source: Ambulatory Visit | Attending: Obstetrics | Admitting: Obstetrics

## 2014-09-03 ENCOUNTER — Encounter (HOSPITAL_COMMUNITY): Payer: Self-pay

## 2014-09-03 DIAGNOSIS — O283 Abnormal ultrasonic finding on antenatal screening of mother: Secondary | ICD-10-CM | POA: Diagnosis not present

## 2014-09-03 DIAGNOSIS — O09523 Supervision of elderly multigravida, third trimester: Secondary | ICD-10-CM | POA: Insufficient documentation

## 2014-09-03 DIAGNOSIS — Z3A36 36 weeks gestation of pregnancy: Secondary | ICD-10-CM | POA: Diagnosis not present

## 2014-09-03 DIAGNOSIS — O09522 Supervision of elderly multigravida, second trimester: Secondary | ICD-10-CM

## 2014-09-03 DIAGNOSIS — O289 Unspecified abnormal findings on antenatal screening of mother: Secondary | ICD-10-CM

## 2014-09-03 DIAGNOSIS — O99213 Obesity complicating pregnancy, third trimester: Secondary | ICD-10-CM

## 2014-09-07 ENCOUNTER — Encounter (HOSPITAL_COMMUNITY)
Admission: RE | Admit: 2014-09-07 | Discharge: 2014-09-07 | Disposition: A | Discharge status: Home / Self Care | Payer: BLUE CROSS/BLUE SHIELD | Source: Ambulatory Visit | Attending: Orthopedic Surgery | Admitting: Orthopedic Surgery

## 2014-09-07 ENCOUNTER — Encounter (HOSPITAL_COMMUNITY): Payer: Self-pay

## 2014-09-07 DIAGNOSIS — T8489XA Other specified complication of internal orthopedic prosthetic devices, implants and grafts, initial encounter: Secondary | ICD-10-CM | POA: Diagnosis not present

## 2014-09-07 DIAGNOSIS — Z3A36 36 weeks gestation of pregnancy: Secondary | ICD-10-CM | POA: Diagnosis not present

## 2014-09-07 HISTORY — DX: Other specified pregnancy related conditions, unspecified trimester: R12

## 2014-09-07 HISTORY — DX: Other specified pregnancy related conditions, unspecified trimester: O26.899

## 2014-09-07 HISTORY — DX: Cough, unspecified: R05.9

## 2014-09-07 HISTORY — DX: Cough: R05

## 2014-09-07 MED ORDER — CHLORHEXIDINE GLUCONATE 4 % EX LIQD: 60.0000 mL | Freq: Once | CUTANEOUS | Status: DC

## 2014-09-07 NOTE — Progress Notes (Signed)
Spoke with Dr.Handy's office and requested clearance from OB/GYN for surgery on 09/09/14

## 2014-09-07 NOTE — Progress Notes (Addendum)
Cardiologist denies  Sees Dr Francoise Ceo who is her GYN  Echo  denies  Stress test denies  Heart cath denies  EKG denies   CXR denies

## 2014-09-07 NOTE — Pre-Procedure Instructions (Signed)
Barbara Yang  09/07/2014      [id number] [place], [place] - [address] [place] [address] Phone: [phone] Fax: [phone]    Your procedure is scheduled on Thurs, June 23 @ 8:00 AM  Report to [facility] Admitting at 6:00 AM  Call this number if you have problems the morning of surgery:  [phone]   Remember:  Do not eat food or drink liquids after midnight.              NO Goody's,BC's,Aleve,Aspirin,Ibuprofen,Fish Oil,or any Herbal Medications.    Do not wear jewelry, make-up or nail polish.  Do not wear lotions, powders, or perfumes.  You may wear deodorant.  Do not shave 48 hours prior to surgery.    Do not bring valuables to the hospital.  [facility] is not responsible for any belongings or valuables.  Contacts, dentures or bridgework may not be worn into surgery.  Leave your suitcase in the car.  After surgery it may be brought to your room.  For patients admitted to the hospital, discharge time will be determined by your treatment team.  Patients discharged the day of surgery will not be allowed to drive home.    Special instructions:  [name] - Preparing for Surgery  Before surgery, you can play an important role.  Because skin is not sterile, your skin needs to be as free of germs as possible.  You can reduce the number of germs on you skin by washing with CHG (chlorahexidine gluconate) soap before surgery.  CHG is an antiseptic cleaner which kills germs and bonds with the skin to continue killing germs even after washing.  Please DO NOT use if you have an allergy to CHG or antibacterial soaps.  If your skin becomes reddened/irritated stop using the CHG and inform your nurse when you arrive at Short Stay.  Do not shave (including legs and underarms) for at least 48 hours prior to the first CHG shower.  You may shave your face.  Please follow these instructions carefully:   1.  Shower with CHG Soap  the night before surgery and the                                morning of Surgery.  2.  If you choose to wash your hair, wash your hair first as usual with your       normal shampoo.  3.  After you shampoo, rinse your hair and body thoroughly to remove the                      Shampoo.  4.  Use CHG as you would any other liquid soap.  You can apply chg directly       to the skin and wash gently with scrungie or a clean washcloth.  5.  Apply the CHG Soap to your body ONLY FROM THE NECK DOWN.        Do not use on open wounds or open sores.  Avoid contact with your eyes,       ears, mouth and genitals (private parts).  Wash genitals (private parts)       with your normal soap.  6.  Wash thoroughly, paying special attention to the area where your surgery        will be performed.  7.  Thoroughly rinse your  body with warm water from the neck down.  8.  DO NOT shower/wash with your normal soap after using and rinsing off       the CHG Soap.  9.  Pat yourself dry with a clean towel.            10.  Wear clean pajamas.            11.  Place clean sheets on your bed the night of your first shower and do not        sleep with pets.  Day of Surgery  Do not apply any lotions/deoderants the morning of surgery.  Please wear clean clothes to the hospital/surgery center.    Please read over the following fact sheets that you were given. Pain Booklet, Coughing and Deep Breathing and Surgical Site Infection Prevention

## 2014-09-08 MED ORDER — CEFAZOLIN SODIUM-DEXTROSE 2-3 GM-% IV SOLR
2.0000 g | INTRAVENOUS | Status: AC
  Administered 2014-09-09: 2 g via INTRAVENOUS
  Filled 2014-09-08: qty 50

## 2014-09-08 MED ORDER — LACTATED RINGERS IV SOLN
INTRAVENOUS | Status: DC
  Administered 2014-09-09: 08:00:00 via INTRAVENOUS

## 2014-09-08 NOTE — Progress Notes (Signed)
Ortho follow up  Notified Dr. Eric Form, neonatology, re: procedure tomorrow  Made aware surgery is a cone, tomorrow am   Barbara Latin, PA-C [facility] [phone] (P) 09/08/2014 11:02 AM

## 2014-09-08 NOTE — Progress Notes (Signed)
Requested most recent lab work from Dr. Elsie Stain office.

## 2014-09-08 NOTE — Progress Notes (Signed)
Called Dr. Magdalene Patricia office to request OB clearance. Stated they would fax to us,this is second request.

## 2014-09-08 NOTE — Progress Notes (Signed)
Anesthesia Chart Review:  Pt is 34 year old female scheduled for hardware removal from L foot on 09/09/2014 with Dr. Carola Frost.   Pt is approximately [redacted] weeks pregnant. OB is Dr. Gaynell Face.   PMH includes: S/p ORIF L lisfranc fracture 04/22/14. Never smoker. BMI 43.  Labs were requested from Dr. Elsie Stain office. If not received, pt will need BMET and CBC DOS.   Pt reported at PAT that she prefers local anesthesia for procedure due to pregnancy. Pt will be assessed by her assigned anesthesiologist DOS.   Silva Bandy, RN in [employer], contacted Dr. Magdalene Patricia office. They have obtained clearance for procedure from Dr. Gaynell Face and will forward a copy to [place]. Silva Bandy reports someone in Dr. Magdalene Patricia office also agreed to notify the neonatologist of date and time of procedure per our protocol.   If no changes and labs acceptable, I anticipate pt can proceed with surgery as scheduled.   Rica Mast, FNP-BC [facility] Short Stay Surgical Center/Anesthesiology Phone: [phone] 09/08/2014 10:01 AM

## 2014-09-08 NOTE — H&P (Signed)
[facility] H&P   Chief Complaint: Symptomatic hardware left foot HPI:   34 year old black female L [redacted] weeks pregnant presents for hardware removal left foot. Patient adamantly requesting hardware removal. She is status post ORIF left Lisfranc fracture dislocation of February 2016. Patient has been cleared by her OB for the procedure. Patient has done well from her surgery recovered without incident  Past Medical History  Diagnosis Date  . Medical history non-contributory   . Cough     non productive  . Heartburn during pregnancy     takes Tums daily    Past Surgical History  Procedure Laterality Date  . Umbilical hernia repair    . Orif ankle fracture Left 04/22/2014    Procedure: OPEN REDUCTION INTERNAL FIXATION (ORIF) LEFT LISFRANC FRACTURE;  Surgeon: Budd Palmer, MD;  Location: [facility] OR;  Service: Orthopedics;  Laterality: Left;    Family History  Problem Relation Age of Onset  . Cancer Mother    Social History:  reports that she has never smoked. She has never used smokeless tobacco. She reports that she does not drink alcohol or use illicit drugs.  Allergies: No Known Allergies  No prescriptions prior to admission    No results found for this or any previous visit (from the past 48 hour(s)). No results found.  Review of Systems  Constitutional: Negative for fever and chills.  Respiratory: Negative for shortness of breath and wheezing.   Cardiovascular: Negative for chest pain and palpitations.  Neurological: Negative for tingling and sensory change.    Physical Exam  Constitutional: She appears well-developed and well-nourished. No distress.  HENT:  Head: Normocephalic and atraumatic.  Cardiovascular: Normal rate and regular rhythm.   Respiratory: Effort normal and breath sounds normal.  Musculoskeletal:  Left foot   Operative wound is well-healed   Foot is nontender   Motor and sensory functions intact   Good Ankle and forefoot motion            Assessment/Plan  34 year old black female, [redacted] weeks pregnant symptomatic hardware left foot  OR for removal of hardware This will likely be done under regional with MAC Neonatology has been made aware Outpatient procedure Patient understands risks and benefits associated with surgery particularly those associated with the fact that she is at [redacted] weeks pregnant. Patient wishes to proceed at this time  Mearl Latin, PA-C [facility] [phone] (P) 09/08/2014, 5:21 PM

## 2014-09-09 ENCOUNTER — Ambulatory Visit (HOSPITAL_COMMUNITY): Payer: BLUE CROSS/BLUE SHIELD | Admitting: Anesthesiology

## 2014-09-09 ENCOUNTER — Encounter (HOSPITAL_COMMUNITY)
Admission: RE | Disposition: A | Discharge status: Home / Self Care | Payer: Self-pay | Source: Ambulatory Visit | Attending: Orthopedic Surgery

## 2014-09-09 ENCOUNTER — Ambulatory Visit (HOSPITAL_COMMUNITY)
Admission: RE | Admit: 2014-09-09 | Discharge: 2014-09-09 | Disposition: A | Discharge status: Home / Self Care | Payer: BLUE CROSS/BLUE SHIELD | Source: Ambulatory Visit | Attending: Orthopedic Surgery | Admitting: Orthopedic Surgery

## 2014-09-09 ENCOUNTER — Ambulatory Visit (HOSPITAL_COMMUNITY): Payer: BLUE CROSS/BLUE SHIELD | Admitting: Emergency Medicine

## 2014-09-09 DIAGNOSIS — T8489XA Other specified complication of internal orthopedic prosthetic devices, implants and grafts, initial encounter: Secondary | ICD-10-CM | POA: Diagnosis not present

## 2014-09-09 DIAGNOSIS — Z3A36 36 weeks gestation of pregnancy: Secondary | ICD-10-CM | POA: Insufficient documentation

## 2014-09-09 DIAGNOSIS — T8484XA Pain due to internal orthopedic prosthetic devices, implants and grafts, initial encounter: Secondary | ICD-10-CM

## 2014-09-09 HISTORY — PX: HARDWARE REMOVAL: SHX979

## 2014-09-09 LAB — CBC
HEMATOCRIT: 33.2 % — AB (ref 36.0–46.0)
HEMOGLOBIN: 11 g/dL — AB (ref 12.0–15.0)
MCH: 28.9 pg (ref 26.0–34.0)
MCHC: 33.1 g/dL (ref 30.0–36.0)
MCV: 87.1 fL (ref 78.0–100.0)
Platelets: 250 10*3/uL (ref 150–400)
RBC: 3.81 MIL/uL — ABNORMAL LOW (ref 3.87–5.11)
RDW: 14.1 % (ref 11.5–15.5)
WBC: 8.3 10*3/uL (ref 4.0–10.5)

## 2014-09-09 LAB — BASIC METABOLIC PANEL
Anion gap: 9 (ref 5–15)
BUN: 5 mg/dL — AB (ref 6–20)
CO2: 22 mmol/L (ref 22–32)
Calcium: 8.7 mg/dL — ABNORMAL LOW (ref 8.9–10.3)
Chloride: 106 mmol/L (ref 101–111)
Creatinine, Ser: 0.61 mg/dL (ref 0.44–1.00)
GFR calc Af Amer: >60 mL/min (ref >60)
Glucose, Bld: 100 mg/dL — ABNORMAL HIGH (ref 65–99)
Potassium: 3.8 mmol/L (ref 3.5–5.1)
Sodium: 137 mmol/L (ref 135–145)

## 2014-09-09 SURGERY — REMOVAL, HARDWARE
Anesthesia: Monitor Anesthesia Care | Site: Foot | Laterality: Left

## 2014-09-09 MED ORDER — BUPIVACAINE-EPINEPHRINE (PF) 0.5% -1:200000 IJ SOLN
INTRAMUSCULAR | Status: AC
  Filled 2014-09-09: qty 30

## 2014-09-09 MED ORDER — BUPIVACAINE-EPINEPHRINE (PF) 0.5% -1:200000 IJ SOLN
INTRAMUSCULAR | Status: DC | PRN
  Administered 2014-09-09: 10 mL via PERINEURAL

## 2014-09-09 MED ORDER — HYDROMORPHONE HCL 1 MG/ML IJ SOLN: 0.2500 mg | INTRAMUSCULAR | Status: DC | PRN

## 2014-09-09 MED ORDER — ROPIVACAINE HCL 5 MG/ML IJ SOLN
INTRAMUSCULAR | Status: DC | PRN
  Administered 2014-09-09: 20 mL

## 2014-09-09 MED ORDER — MIDAZOLAM HCL 2 MG/2ML IJ SOLN
INTRAMUSCULAR | Status: AC
  Filled 2014-09-09: qty 2

## 2014-09-09 MED ORDER — PROPOFOL 10 MG/ML IV BOLUS
INTRAVENOUS | Status: AC
  Filled 2014-09-09: qty 20

## 2014-09-09 MED ORDER — FENTANYL CITRATE (PF) 250 MCG/5ML IJ SOLN
INTRAMUSCULAR | Status: AC
  Filled 2014-09-09: qty 5

## 2014-09-09 MED ORDER — HYDROCODONE-ACETAMINOPHEN 5-325 MG PO TABS: 1.0000 | ORAL_TABLET | Freq: Two times a day (BID) | ORAL | Status: DC | PRN

## 2014-09-09 SURGICAL SUPPLY — 64 items
BANDAGE ELASTIC 4 VELCRO ST LF (GAUZE/BANDAGES/DRESSINGS) ×3 IMPLANT
BANDAGE ELASTIC 6 VELCRO ST LF (GAUZE/BANDAGES/DRESSINGS) ×3 IMPLANT
BANDAGE ESMARK 6X9 LF (GAUZE/BANDAGES/DRESSINGS) ×1 IMPLANT
BNDG COHESIVE 6X5 TAN STRL LF (GAUZE/BANDAGES/DRESSINGS) ×3 IMPLANT
BNDG ESMARK 6X9 LF (GAUZE/BANDAGES/DRESSINGS) ×3
BNDG GAUZE ELAST 4 BULKY (GAUZE/BANDAGES/DRESSINGS) ×6 IMPLANT
BRUSH SCRUB DISP (MISCELLANEOUS) ×6 IMPLANT
CLEANER TIP ELECTROSURG 2X2 (MISCELLANEOUS) ×3 IMPLANT
CLOSURE WOUND 1/2 X4 (GAUZE/BANDAGES/DRESSINGS)
COVER SURGICAL LIGHT HANDLE (MISCELLANEOUS) ×6 IMPLANT
CUFF TOURNIQUET SINGLE 18IN (TOURNIQUET CUFF) IMPLANT
CUFF TOURNIQUET SINGLE 24IN (TOURNIQUET CUFF) IMPLANT
CUFF TOURNIQUET SINGLE 34IN LL (TOURNIQUET CUFF) IMPLANT
DRAPE C-ARM 42X72 X-RAY (DRAPES) IMPLANT
DRAPE C-ARMOR (DRAPES) ×3 IMPLANT
DRAPE OEC MINIVIEW 54X84 (DRAPES) ×3 IMPLANT
DRAPE U-SHAPE 47X51 STRL (DRAPES) ×3 IMPLANT
DRSG ADAPTIC 3X8 NADH LF (GAUZE/BANDAGES/DRESSINGS) ×3 IMPLANT
DRSG EMULSION OIL 3X3 NADH (GAUZE/BANDAGES/DRESSINGS) ×3 IMPLANT
ELECT REM PT RETURN 9FT ADLT (ELECTROSURGICAL) ×3
ELECTRODE REM PT RTRN 9FT ADLT (ELECTROSURGICAL) ×1 IMPLANT
EVACUATOR 1/8 PVC DRAIN (DRAIN) IMPLANT
GAUZE SPONGE 4X4 12PLY STRL (GAUZE/BANDAGES/DRESSINGS) ×3 IMPLANT
GLOVE BIO SURGEON STRL SZ7.5 (GLOVE) ×3 IMPLANT
GLOVE BIO SURGEON STRL SZ8 (GLOVE) ×3 IMPLANT
GLOVE BIOGEL PI IND STRL 7.5 (GLOVE) ×1 IMPLANT
GLOVE BIOGEL PI IND STRL 8 (GLOVE) ×1 IMPLANT
GLOVE BIOGEL PI INDICATOR 7.5 (GLOVE) ×2
GLOVE BIOGEL PI INDICATOR 8 (GLOVE) ×2
GOWN STRL REUS W/ TWL LRG LVL3 (GOWN DISPOSABLE) ×2 IMPLANT
GOWN STRL REUS W/ TWL XL LVL3 (GOWN DISPOSABLE) ×1 IMPLANT
GOWN STRL REUS W/TWL LRG LVL3 (GOWN DISPOSABLE) ×4
GOWN STRL REUS W/TWL XL LVL3 (GOWN DISPOSABLE) ×2
KIT BASIN OR (CUSTOM PROCEDURE TRAY) ×3 IMPLANT
KIT ROOM TURNOVER OR (KITS) ×3 IMPLANT
MANIFOLD NEPTUNE II (INSTRUMENTS) ×3 IMPLANT
NEEDLE 22X1 1/2 (OR ONLY) (NEEDLE) IMPLANT
NS IRRIG 1000ML POUR BTL (IV SOLUTION) ×3 IMPLANT
PACK ORTHO EXTREMITY (CUSTOM PROCEDURE TRAY) ×3 IMPLANT
PAD ARMBOARD 7.5X6 YLW CONV (MISCELLANEOUS) ×6 IMPLANT
PAD CAST 4YDX4 CTTN HI CHSV (CAST SUPPLIES) ×1 IMPLANT
PADDING CAST COTTON 4X4 STRL (CAST SUPPLIES) ×2
PADDING CAST COTTON 6X4 STRL (CAST SUPPLIES) ×3 IMPLANT
SPONGE GAUZE 4X4 12PLY STER LF (GAUZE/BANDAGES/DRESSINGS) ×3 IMPLANT
SPONGE LAP 18X18 X RAY DECT (DISPOSABLE) ×3 IMPLANT
SPONGE SCRUB IODOPHOR (GAUZE/BANDAGES/DRESSINGS) ×3 IMPLANT
STAPLER VISISTAT 35W (STAPLE) IMPLANT
STOCKINETTE IMPERVIOUS LG (DRAPES) ×3 IMPLANT
STRIP CLOSURE SKIN 1/2X4 (GAUZE/BANDAGES/DRESSINGS) IMPLANT
SUCTION FRAZIER TIP 10 FR DISP (SUCTIONS) IMPLANT
SUT ETHILON 3 0 PS 1 (SUTURE) IMPLANT
SUT PDS AB 2-0 CT1 27 (SUTURE) IMPLANT
SUT VIC AB 0 CT1 27 (SUTURE)
SUT VIC AB 0 CT1 27XBRD ANBCTR (SUTURE) IMPLANT
SUT VIC AB 2-0 CT1 27 (SUTURE)
SUT VIC AB 2-0 CT1 TAPERPNT 27 (SUTURE) IMPLANT
SYR CONTROL 10ML LL (SYRINGE) IMPLANT
TOWEL OR 17X24 6PK STRL BLUE (TOWEL DISPOSABLE) ×6 IMPLANT
TOWEL OR 17X26 10 PK STRL BLUE (TOWEL DISPOSABLE) ×6 IMPLANT
TUBE CONNECTING 12'X1/4 (SUCTIONS) ×1
TUBE CONNECTING 12X1/4 (SUCTIONS) ×2 IMPLANT
UNDERPAD 30X30 INCONTINENT (UNDERPADS AND DIAPERS) ×3 IMPLANT
WATER STERILE IRR 1000ML POUR (IV SOLUTION) ×6 IMPLANT
YANKAUER SUCT BULB TIP NO VENT (SUCTIONS) ×3 IMPLANT

## 2014-09-09 NOTE — Transfer of Care (Signed)
Immediate Anesthesia Transfer of Care Note  Patient: Barbara Yang  Procedure(s) Performed: Procedure(s): HARDWARE REMOVAL LEFT FOOT  (Left)  Patient Location: PACU  Anesthesia Type:Regional  Level of Consciousness: awake, alert , oriented, unresponsive and responds to stimulation  Airway & Oxygen Therapy: Patient Spontanous Breathing  Post-op Assessment: Report given to RN, Post -op Vital signs reviewed and stable, Patient moving all extremities X 4 and Patient able to stick tongue midline  Post vital signs: stable  Last Vitals:  Filed Vitals:   09/09/14 0654  BP: 103/52  Pulse: 91  Temp: 37.2 C  Resp: 18    Complications: No apparent anesthesia complications

## 2014-09-09 NOTE — Op Note (Signed)
[name], MCCOLLOM             ACCOUNT NO.:  [id number]  MEDICAL RECORD NO.:  [id number]  LOCATION:  [facility]                         FACILITY:  [facility]  PHYSICIAN:  Doralee Albino. Carola Frost, M.D. DATE OF BIRTH:  July 04, 1980  DATE OF PROCEDURE:  09/09/2014 DATE OF DISCHARGE:  09/09/2014                              OPERATIVE REPORT   PREOPERATIVE DIAGNOSIS:  Symptomatic hardware left foot, retained Lisfranc screw.  POSTOPERATIVE DIAGNOSIS:  Symptomatic hardware, left foot, retained Lisfranc screw.  PROCEDURE:  Removal of deep implant, left foot.  SURGEON:  Doralee Albino. Carola Frost, M.D.  ASSISTANT:  None.  ANESTHESIA:  Regional block.  IV FLUIDS:  750 mL.  ESTIMATED BLOOD LOSS:  10 mL.  DISPOSITION:  PACU.  CONDITION:  Stable.  BRIEF SUMMARY AND INDICATION FOR PROCEDURE:  Barbara Yang is a very pleasant 34 year old female who had a Lisfranc injury just over 4 months ago.  We had previously discussed removal at 4 months and the patient strongly wished to proceed with this despite being [redacted] weeks pregnant because of concerns about the possibility of screw breakage, which would make it extremely difficult to remove the hardware.  We did discuss this with both her OB and the neonatologist, and they felt that it would be safe to proceed under regional block.  I also conferred with Dr. Sampson Goon, the anesthesiologist.  Fetal heart tones were obtained of both pre and postoperatively and showed no sign of abnormality and distress.  She understood the risks to include, the possibility of loss of reduction, nerve or vessel injury, infection, failure to alleviate all symptoms and need for further surgery, which could include late arthrodesis among others.  The patient understood all these risks and wished to proceed.  We furthermore discussed specifically premature delivery and other obstetric complications.  DESCRIPTION OF PROCEDURE:  [name] was given 2 g of Ancef preoperatively  as well  as an ankle block.  She was taken to the operating room where the left lower extremity was prepped and draped in usual sterile fashion.  The old incision was made about the midfoot and eventually would be extended proximally and distally.  I initially tried a percutaneous technique for removal with use of the pin for the cannulated screw, however, became increasingly clear that the head of the screw was grown over with bone.  Consequently, I did enlarge the incision as I exposed the area where the head of the screw should be under direct visualization.  It had in fact grown over with bone.  Using a small curette, I was able to remove the overgrowth and placed the guide pin within the center of the screw.  The screwdriver was advanced down to this and the screw was then withdrawn.  I did supplement the block with some local regional, but the patient reported that her pain control was quite good.  The wound was irrigated thoroughly, closed with a 2-0 Vicryl and 3-0 horizontal mattress sutures for the skin.  Sterile gentle compressive dressing was applied.  The patient was taken to PACU in stable condition.  PROGNOSIS:  Ms. Tift will be weightbearing as tolerated with ice, elevation, and Tylenol for pain control.  She will contact us  if she has any problems or concerns or questions before we anticipate seeing her back in the office in 10 days for removal of sutures.     Doralee Albino. Carola Frost, M.D.     [facility]  D:  09/09/2014  T:  09/09/2014  Job:  [id number]

## 2014-09-09 NOTE — Anesthesia Preprocedure Evaluation (Addendum)
Anesthesia Evaluation  Patient identified by MRN, date of birth, ID band Patient awake    Reviewed: Allergy & Precautions, H&P , NPO status , Patient's Chart, lab work & pertinent test results  Airway Mallampati: II  TM Distance: >3 FB Neck ROM: Full    Dental no notable dental hx. (+) Teeth Intact, Dental Advisory Given   Pulmonary neg pulmonary ROS,  breath sounds clear to auscultation  Pulmonary exam normal       Cardiovascular negative cardio ROS  Rhythm:Regular Rate:Normal     Neuro/Psych negative neurological ROS  negative psych ROS   GI/Hepatic negative GI ROS, Neg liver ROS,   Endo/Other  negative endocrine ROS  Renal/GU negative Renal ROS  negative genitourinary   Musculoskeletal   Abdominal   Peds  Hematology negative hematology ROS (+)   Anesthesia Other Findings   Reproductive/Obstetrics negative OB ROS                            Anesthesia Physical Anesthesia Plan  ASA: II  Anesthesia Plan: MAC   Post-op Pain Management:    Induction: Intravenous  Airway Management Planned: Simple Face Mask  Additional Equipment:   Intra-op Plan:   Post-operative Plan:   Informed Consent: I have reviewed the patients History and Physical, chart, labs and discussed the procedure including the risks, benefits and alternatives for the proposed anesthesia with the patient or authorized representative who has indicated his/her understanding and acceptance.   Dental advisory given  Plan Discussed with: CRNA  Anesthesia Plan Comments:        Anesthesia Quick Evaluation

## 2014-09-09 NOTE — Brief Op Note (Signed)
09/09/2014  10:44 AM  PATIENT:  Barbara Yang  34 y.o. female  PRE-OPERATIVE DIAGNOSIS:  SYMPTOMATIC HARDWARE LEFT FOOT  POST-OPERATIVE DIAGNOSIS:  SYMPTOMATIC HARDWARE LEFT FOOT  PROCEDURE:  Procedure(s): HARDWARE REMOVAL LEFT FOOT  (Left)  SURGEON:  Surgeon(s) and Role:    * Myrene Galas, MD - Primary  PHYSICIAN ASSISTANT: None  ANESTHESIA:   Regional block  I/O:  Total I/O In: 750 [P.O.:250; I.V.:500] Out: 10 [Blood:10]  TOURNIQUET:  * No tourniquets in log *  DICTATION: .Other Dictation: Dictation Number [id number]

## 2014-09-09 NOTE — Discharge Instructions (Signed)
[facility] Discharge Instructions   General Discharge Instructions  WEIGHT BEARING STATUS: Weightbearing as tolerated  RANGE OF MOTION/ACTIVITY: as tolerated   PAIN MEDICATION USE AND EXPECTATIONS  You have likely been given narcotic medications to help control your pain.  After a traumatic event that results in an fracture (broken bone) with or without surgery, it is ok to use narcotic pain medications to help control one's pain.  We understand that everyone responds to pain differently and each individual patient will be evaluated on a regular basis for the continued need for narcotic medications. Ideally, narcotic medication use should last no more than 6-8 weeks (coinciding with fracture healing).   As a patient it is your responsibility as well to monitor narcotic medication use and report the amount and frequency you use these medications when you come to your office visit.   We would also advise that if you are using narcotic medications, you should take a dose prior to therapy to maximize you participation.  IF YOU ARE ON NARCOTIC MEDICATIONS IT IS NOT PERMISSIBLE TO OPERATE A MOTOR VEHICLE (MOTORCYCLE/CAR/TRUCK/MOPED) OR HEAVY MACHINERY DO NOT MIX NARCOTICS WITH OTHER CNS (CENTRAL NERVOUS SYSTEM) DEPRESSANTS SUCH AS ALCOHOL  Diet: as you were eating previously.  Can use over the counter stool softeners and bowel preparations, such as Miralax, to help with bowel movements.  Narcotics can be constipating.  Be sure to drink plenty of fluids  Wound Care: wound care starting on 09/11/2014, see below   STOP SMOKING OR USING NICOTINE PRODUCTS!!!!  As discussed nicotine severely impairs your body's ability to heal surgical and traumatic wounds but also impairs bone healing.  Wounds and bone heal by forming microscopic blood vessels (angiogenesis) and nicotine is a vasoconstrictor (essentially, shrinks blood vessels).  Therefore, if vasoconstriction occurs to these microscopic  blood vessels they essentially disappear and are unable to deliver necessary nutrients to the healing tissue.  This is one modifiable factor that you can do to dramatically increase your chances of healing your injury.    (This means no smoking, no nicotine gum, patches, etc)  DO NOT USE NONSTEROIDAL ANTI-INFLAMMATORY DRUGS (NSAID'S)  Using products such as Advil (ibuprofen), Aleve (naproxen), Motrin (ibuprofen) for additional pain control during fracture healing can delay and/or prevent the healing response.  If you would like to take over the counter (OTC) medication, Tylenol (acetaminophen) is ok.  However, some narcotic medications that are given for pain control contain acetaminophen as well. Therefore, you should not exceed more than 4000 mg of tylenol in a day if you do not have liver disease.  Also note that there are may OTC medicines, such as cold medicines and allergy medicines that my contain tylenol as well.  If you have any questions about medications and/or interactions please ask your doctor/PA or your pharmacist.      ICE AND ELEVATE INJURED/OPERATIVE EXTREMITY  Using ice and elevating the injured extremity above your heart can help with swelling and pain control.  Icing in a pulsatile fashion, such as 20 minutes on and 20 minutes off, can be followed.    Do not place ice directly on skin. Make sure there is a barrier between to skin and the ice pack.    Using frozen items such as frozen peas works well as the conform nicely to the are that needs to be iced.  USE AN ACE WRAP OR TED HOSE FOR SWELLING CONTROL  In addition to icing and elevation, Ace wraps or TED hose are  used to help limit and resolve swelling.  It is recommended to use Ace wraps or TED hose until you are informed to stop.    When using Ace Wraps start the wrapping distally (farthest away from the body) and wrap proximally (closer to the body)   Example: If you had surgery on your leg or thing and you do not have a  splint on, start the ace wrap at the toes and work your way up to the thigh        If you had surgery on your upper extremity and do not have a splint on, start the ace wrap at your fingers and work your way up to the upper arm  IF YOU ARE IN A SPLINT OR CAST DO NOT REMOVE IT FOR ANY REASON   If your splint gets wet for any reason please contact the office immediately. You may shower in your splint or cast as long as you keep it dry.  This can be done by wrapping in a cast cover or garbage back (or similar)  Do Not stick any thing down your splint or cast such as pencils, money, or hangers to try and scratch yourself with.  If you feel itchy take benadryl as prescribed on the bottle for itching  IF YOU ARE IN A CAM BOOT (BLACK BOOT)  You may remove boot periodically. Perform daily dressing changes as noted below.  Wash the liner of the boot regularly and wear a sock when wearing the boot. It is recommended that you sleep in the boot until told otherwise  CALL THE OFFICE WITH ANY QUESTIONS OR CONCERTS: [phone]     Discharge Pin Site Instructions  Dress pins daily with Kerlix roll starting on POD 2. Wrap the Kerlix so that it tamps the skin down around the pin-skin interface to prevent/limit motion of the skin relative to the pin.  (Pin-skin motion is the primary cause of pain and infection related to external fixator pin sites).  Remove any crust or coagulum that may obstruct drainage with a saline moistened gauze or soap and water.  After POD 3, if there is no discernable drainage on the pin site dressing, the interval for change can by increased to every other day.  You may shower with the fixator, cleaning all pin sites gently with soap and water.  If you have a surgical wound this needs to be completely dry and without drainage before showering.  The extremity can be lifted by the fixator to facilitate wound care and transfers.  Notify the office/Doctor if you experience increasing  drainage, redness, or pain from a pin site, or if you notice purulent (thick, snot-like) drainage.  Discharge Wound Care Instructions  Do NOT apply any ointments, solutions or lotions to pin sites or surgical wounds.  These prevent needed drainage and even though solutions like hydrogen peroxide kill bacteria, they also damage cells lining the pin sites that help fight infection.  Applying lotions or ointments can keep the wounds moist and can cause them to breakdown and open up as well. This can increase the risk for infection. When in doubt call the office.  Surgical incisions should be dressed daily.  If any drainage is noted, use one layer of adaptic, then gauze, Kerlix, and an ace wrap.  Once the incision is completely dry and without drainage, it may be left open to air out.  Showering may begin 36-48 hours later.  Cleaning gently with soap and water.  Traumatic wounds  should be dressed daily as well.    One layer of adaptic, gauze, Kerlix, then ace wrap.  The adaptic can be discontinued once the draining has ceased    If you have a wet to dry dressing: wet the gauze with saline the squeeze as much saline out so the gauze is moist (not soaking wet), place moistened gauze over wound, then place a dry gauze over the moist one, followed by Kerlix wrap, then ace wrap.

## 2014-09-09 NOTE — Anesthesia Procedure Notes (Signed)
Anesthesia Regional Block:  Ankle block  Pre-Anesthetic Checklist: ,, timeout performed, Correct Patient, Correct Site, Correct Laterality, Correct Procedure, Correct Position, site marked, Risks and benefits discussed, pre-op evaluation, post-op pain management  Laterality: Left  Prep: Maximum Sterile Barrier Precautions used and chloraprep       Needles:  Injection technique: Single-shot  Needle Type: Other     Needle Length: 3cm  Needle Gauge: 25 and 25 G    Additional Needles: Ankle block Narrative:  Start time: 09/09/2014 8:30 AM End time: 09/09/2014 8:35 AM Injection made incrementally with aspirations every 5 mL. Anesthesiologist: Gaynelle Adu  Additional Notes: Superficial and deep Peroneal, and Saphenous injected. Negative aspiration of blood prior to all injections.

## 2014-09-09 NOTE — Progress Notes (Signed)
[facility]--states all is well and normal. Notified Dr.Fitzgerald whom states pt is okay to go home now.

## 2014-09-09 NOTE — Anesthesia Postprocedure Evaluation (Signed)
  Anesthesia Post-op Note  Patient: Barbara Yang  Procedure(s) Performed: Procedure(s): HARDWARE REMOVAL LEFT FOOT  (Left)  Patient Location: PACU  Anesthesia Type: MAC and ankle block  Level of Consciousness: awake and alert   Airway and Oxygen Therapy: Patient Spontanous Breathing  Post-op Pain: none  Post-op Assessment: Post-op Vital signs reviewed, Patient's Cardiovascular Status Stable and Respiratory Function Stable  Post-op Vital Signs: Reviewed  Filed Vitals:   09/09/14 1010  BP: 116/63  Pulse: 90  Temp: 37 C  Resp: 23    Complications: No apparent anesthesia complications

## 2014-09-10 ENCOUNTER — Encounter (HOSPITAL_COMMUNITY): Payer: Self-pay | Admitting: Orthopedic Surgery

## 2014-09-25 ENCOUNTER — Encounter (HOSPITAL_COMMUNITY): Payer: Self-pay | Admitting: *Deleted

## 2014-09-25 ENCOUNTER — Inpatient Hospital Stay (HOSPITAL_COMMUNITY)
Admission: AD | Admit: 2014-09-25 | Discharge: 2014-09-28 | DRG: 775 | Disposition: A | Discharge status: Home / Self Care | Payer: BLUE CROSS/BLUE SHIELD | Source: Ambulatory Visit | Attending: Obstetrics | Admitting: Obstetrics

## 2014-09-25 DIAGNOSIS — O9962 Diseases of the digestive system complicating childbirth: Secondary | ICD-10-CM | POA: Diagnosis present

## 2014-09-25 DIAGNOSIS — O99214 Obesity complicating childbirth: Secondary | ICD-10-CM | POA: Diagnosis present

## 2014-09-25 DIAGNOSIS — K219 Gastro-esophageal reflux disease without esophagitis: Secondary | ICD-10-CM | POA: Diagnosis present

## 2014-09-25 DIAGNOSIS — O09523 Supervision of elderly multigravida, third trimester: Secondary | ICD-10-CM

## 2014-09-25 DIAGNOSIS — Z3A39 39 weeks gestation of pregnancy: Secondary | ICD-10-CM | POA: Diagnosis present

## 2014-09-25 DIAGNOSIS — Z6841 Body Mass Index (BMI) 40.0 and over, adult: Secondary | ICD-10-CM | POA: Diagnosis not present

## 2014-09-25 DIAGNOSIS — Z8249 Family history of ischemic heart disease and other diseases of the circulatory system: Secondary | ICD-10-CM

## 2014-09-25 DIAGNOSIS — IMO0001 Reserved for inherently not codable concepts without codable children: Secondary | ICD-10-CM

## 2014-09-25 LAB — POCT FERN TEST: POCT Fern Test: POSITIVE

## 2014-09-25 LAB — CBC
HEMATOCRIT: 34.5 % — AB (ref 36.0–46.0)
HEMOGLOBIN: 11.6 g/dL — AB (ref 12.0–15.0)
MCH: 29.4 pg (ref 26.0–34.0)
MCHC: 33.6 g/dL (ref 30.0–36.0)
MCV: 87.6 fL (ref 78.0–100.0)
Platelets: 251 10*3/uL (ref 150–400)
RBC: 3.94 MIL/uL (ref 3.87–5.11)
RDW: 14.4 % (ref 11.5–15.5)
WBC: 7.6 10*3/uL (ref 4.0–10.5)

## 2014-09-25 MED ORDER — LACTATED RINGERS IV SOLN
INTRAVENOUS | Status: DC
  Administered 2014-09-25: via INTRAVENOUS
  Administered 2014-09-26: 125 mL/h via INTRAVENOUS

## 2014-09-25 NOTE — MAU Note (Addendum)
Started having mucus discharge yesterday.  Felt leaking at 9:30 pm and panty liner was wet.  Cleaned up and put on new pantyliner and hasn't felt episode like that since.  Baby moving well. Closed cervix at last appt. No bleeding.

## 2014-09-26 ENCOUNTER — Encounter (HOSPITAL_COMMUNITY): Payer: Self-pay | Admitting: General Practice

## 2014-09-26 ENCOUNTER — Inpatient Hospital Stay (HOSPITAL_COMMUNITY): Payer: BLUE CROSS/BLUE SHIELD | Admitting: Anesthesiology

## 2014-09-26 DIAGNOSIS — IMO0001 Reserved for inherently not codable concepts without codable children: Secondary | ICD-10-CM

## 2014-09-26 LAB — TYPE AND SCREEN
ABO/RH(D): O POS
ANTIBODY SCREEN: NEGATIVE

## 2014-09-26 LAB — RPR: RPR: NONREACTIVE

## 2014-09-26 MED ORDER — OXYCODONE-ACETAMINOPHEN 5-325 MG PO TABS: 2.0000 | ORAL_TABLET | ORAL | Status: DC | PRN

## 2014-09-26 MED ORDER — BENZOCAINE-MENTHOL 20-0.5 % EX AERO: 1.0000 "application " | INHALATION_SPRAY | CUTANEOUS | Status: DC | PRN

## 2014-09-26 MED ORDER — ONDANSETRON HCL 4 MG/2ML IJ SOLN: 4.0000 mg | INTRAMUSCULAR | Status: DC | PRN

## 2014-09-26 MED ORDER — FLEET ENEMA 7-19 GM/118ML RE ENEM: 1.0000 | ENEMA | RECTAL | Status: DC | PRN

## 2014-09-26 MED ORDER — DIPHENHYDRAMINE HCL 50 MG/ML IJ SOLN: 12.5000 mg | INTRAMUSCULAR | Status: DC | PRN

## 2014-09-26 MED ORDER — OXYCODONE-ACETAMINOPHEN 5-325 MG PO TABS
1.0000 | ORAL_TABLET | ORAL | Status: DC | PRN
  Administered 2014-09-28: 1 via ORAL

## 2014-09-26 MED ORDER — ACETAMINOPHEN 325 MG PO TABS: 650.0000 mg | ORAL_TABLET | ORAL | Status: DC | PRN

## 2014-09-26 MED ORDER — TERBUTALINE SULFATE 1 MG/ML IJ SOLN
0.2500 mg | Freq: Once | INTRAMUSCULAR | Status: DC | PRN
  Filled 2014-09-26: qty 1

## 2014-09-26 MED ORDER — DIPHENHYDRAMINE HCL 25 MG PO CAPS: 25.0000 mg | ORAL_CAPSULE | Freq: Four times a day (QID) | ORAL | Status: DC | PRN

## 2014-09-26 MED ORDER — LIDOCAINE HCL (PF) 1 % IJ SOLN
30.0000 mL | INTRAMUSCULAR | Status: DC | PRN
Start: 2014-09-26 — End: 2014-09-26
  Filled 2014-09-26: qty 30

## 2014-09-26 MED ORDER — LACTATED RINGERS IV SOLN
500.0000 mL | INTRAVENOUS | Status: DC | PRN
  Administered 2014-09-26: 1000 mL via INTRAVENOUS

## 2014-09-26 MED ORDER — ONDANSETRON HCL 4 MG/2ML IJ SOLN
4.0000 mg | Freq: Four times a day (QID) | INTRAMUSCULAR | Status: DC | PRN
  Administered 2014-09-26: 4 mg via INTRAVENOUS
  Filled 2014-09-26: qty 2

## 2014-09-26 MED ORDER — PHENYLEPHRINE 40 MCG/ML (10ML) SYRINGE FOR IV PUSH (FOR BLOOD PRESSURE SUPPORT)
80.0000 ug | PREFILLED_SYRINGE | INTRAVENOUS | Status: DC | PRN
  Filled 2014-09-26: qty 20
  Filled 2014-09-26: qty 2

## 2014-09-26 MED ORDER — PRENATAL MULTIVITAMIN CH
1.0000 | ORAL_TABLET | Freq: Every day | ORAL | Status: DC
  Administered 2014-09-27 – 2014-09-28 (×2): 1 via ORAL
  Filled 2014-09-26 (×2): qty 1

## 2014-09-26 MED ORDER — OXYCODONE-ACETAMINOPHEN 5-325 MG PO TABS
1.0000 | ORAL_TABLET | ORAL | Status: DC | PRN
  Administered 2014-09-27 (×2): 1 via ORAL
  Filled 2014-09-26 (×3): qty 1

## 2014-09-26 MED ORDER — OXYTOCIN 40 UNITS IN LACTATED RINGERS INFUSION - SIMPLE MED
1.0000 m[IU]/min | INTRAVENOUS | Status: DC
  Administered 2014-09-26: 2 m[IU]/min via INTRAVENOUS
  Filled 2014-09-26: qty 1000

## 2014-09-26 MED ORDER — SENNOSIDES-DOCUSATE SODIUM 8.6-50 MG PO TABS
2.0000 | ORAL_TABLET | ORAL | Status: DC
  Administered 2014-09-26 – 2014-09-28 (×2): 2 via ORAL
  Filled 2014-09-26 (×2): qty 2

## 2014-09-26 MED ORDER — ZOLPIDEM TARTRATE 5 MG PO TABS: 5.0000 mg | ORAL_TABLET | Freq: Every evening | ORAL | Status: DC | PRN

## 2014-09-26 MED ORDER — DIBUCAINE 1 % RE OINT: 1.0000 "application " | TOPICAL_OINTMENT | RECTAL | Status: DC | PRN

## 2014-09-26 MED ORDER — LANOLIN HYDROUS EX OINT: TOPICAL_OINTMENT | CUTANEOUS | Status: DC | PRN

## 2014-09-26 MED ORDER — OXYTOCIN 40 UNITS IN LACTATED RINGERS INFUSION - SIMPLE MED: 62.5000 mL/h | INTRAVENOUS | Status: DC

## 2014-09-26 MED ORDER — IBUPROFEN 600 MG PO TABS
600.0000 mg | ORAL_TABLET | Freq: Four times a day (QID) | ORAL | Status: DC
  Administered 2014-09-26 – 2014-09-28 (×8): 600 mg via ORAL
  Filled 2014-09-26 (×8): qty 1

## 2014-09-26 MED ORDER — OXYTOCIN BOLUS FROM INFUSION: 500.0000 mL | INTRAVENOUS | Status: DC

## 2014-09-26 MED ORDER — EPHEDRINE 5 MG/ML INJ
10.0000 mg | INTRAVENOUS | Status: DC | PRN
  Filled 2014-09-26: qty 2

## 2014-09-26 MED ORDER — ONDANSETRON HCL 4 MG PO TABS: 4.0000 mg | ORAL_TABLET | ORAL | Status: DC | PRN

## 2014-09-26 MED ORDER — FERROUS SULFATE 325 (65 FE) MG PO TABS
325.0000 mg | ORAL_TABLET | Freq: Two times a day (BID) | ORAL | Status: DC
  Administered 2014-09-27 – 2014-09-28 (×4): 325 mg via ORAL
  Filled 2014-09-26 (×4): qty 1

## 2014-09-26 MED ORDER — TETANUS-DIPHTH-ACELL PERTUSSIS 5-2.5-18.5 LF-MCG/0.5 IM SUSP
0.5000 mL | Freq: Once | INTRAMUSCULAR | Status: AC
  Administered 2014-09-28: 0.5 mL via INTRAMUSCULAR
  Filled 2014-09-26: qty 0.5

## 2014-09-26 MED ORDER — CITRIC ACID-SODIUM CITRATE 334-500 MG/5ML PO SOLN
30.0000 mL | ORAL | Status: DC | PRN
  Administered 2014-09-26: 30 mL via ORAL
  Filled 2014-09-26 (×2): qty 15

## 2014-09-26 MED ORDER — FENTANYL CITRATE (PF) 100 MCG/2ML IJ SOLN
100.0000 ug | INTRAMUSCULAR | Status: DC | PRN
  Administered 2014-09-26: 100 ug via INTRAVENOUS
  Filled 2014-09-26: qty 2

## 2014-09-26 MED ORDER — FENTANYL 2.5 MCG/ML BUPIVACAINE 1/10 % EPIDURAL INFUSION (WH - ANES)
14.0000 mL/h | INTRAMUSCULAR | Status: DC | PRN
  Administered 2014-09-26: 13 mL/h via EPIDURAL
  Administered 2014-09-26 (×2): 14 mL/h via EPIDURAL
  Filled 2014-09-26 (×2): qty 125

## 2014-09-26 MED ORDER — LIDOCAINE HCL (PF) 1 % IJ SOLN
INTRAMUSCULAR | Status: DC | PRN
  Administered 2014-09-26 (×2): 4 mL via EPIDURAL

## 2014-09-26 MED ORDER — WITCH HAZEL-GLYCERIN EX PADS: 1.0000 "application " | MEDICATED_PAD | CUTANEOUS | Status: DC | PRN

## 2014-09-26 MED ORDER — SIMETHICONE 80 MG PO CHEW: 80.0000 mg | CHEWABLE_TABLET | ORAL | Status: DC | PRN

## 2014-09-26 NOTE — H&P (Signed)
This is Dr. Francoise CeoBernard Chassidy Layson dictating the history and physical on  Barbara Yang she's a 34 year old gravida 7 para 1 EDC 714 negative GBS membranes ruptured 11 PM last night and she came in with occasional contractions she received Pitocin stimulation today and had a normal vaginal delivery of a female Apgar 9 and 9 placenta spontaneous intact no episiotomy or laceration Past surgical history negative Past medical history during this pregnancy the patient suffered a fall and broke her left foot she had surgery and screws were put in by the orthopedic doctor she also had a walking cast and during that time MFM suggested Lovenox and she was on Lovenox 40 mg [place]  until  walking boot was discontinued Social history denies smoking drinking alcohol use Family history mother has hypertension System review negative Physical exam well-developed female post delivery HEENT negative Lungs clear to P&A Heart regular rhythm no murmurs no gallops Breasts negative Abdomen uterus 20 week postpartum size Pelvic as described above Extremities negative

## 2014-09-26 NOTE — Anesthesia Preprocedure Evaluation (Addendum)
Anesthesia Evaluation  Patient identified by MRN, date of birth, ID band Patient awake    Reviewed: Allergy & Precautions, H&P , NPO status , Patient's Chart, lab work & pertinent test results  Airway Mallampati: III  TM Distance: >3 FB Neck ROM: full    Dental no notable dental hx.    Pulmonary neg pulmonary ROS,  breath sounds clear to auscultation  Pulmonary exam normal       Cardiovascular negative cardio ROS Normal cardiovascular exam    Neuro/Psych negative neurological ROS  negative psych ROS   GI/Hepatic Neg liver ROS, GERD-  Medicated and Controlled,  Endo/Other  Morbid obesity  Renal/GU negative Renal ROS  negative genitourinary   Musculoskeletal   Abdominal (+) + obese,   Peds  Hematology  (+) anemia ,   Anesthesia Other Findings   Reproductive/Obstetrics (+) Pregnancy AMA                           Anesthesia Physical Anesthesia Plan  ASA: III  Anesthesia Plan: Epidural   Post-op Pain Management:    Induction:   Airway Management Planned: Natural Airway  Additional Equipment:   Intra-op Plan:   Post-operative Plan:   Informed Consent: I have reviewed the patients History and Physical, chart, labs and discussed the procedure including the risks, benefits and alternatives for the proposed anesthesia with the patient or authorized representative who has indicated his/her understanding and acceptance.     Plan Discussed with: Anesthesiologist  Anesthesia Plan Comments:        Anesthesia Quick Evaluation

## 2014-09-26 NOTE — Anesthesia Procedure Notes (Signed)
Epidural Patient location during procedure: OB Start time: 09/26/2014 7:22 AM  Preanesthetic Checklist Completed: patient identified, site marked, surgical consent, pre-op evaluation, timeout performed, IV checked, risks and benefits discussed and monitors and equipment checked  Epidural Patient position: sitting Prep: site prepped and draped and DuraPrep Patient monitoring: continuous pulse ox and blood pressure Approach: midline Location: L3-L4 Injection technique: LOR air  Needle:  Needle type: Tuohy  Needle gauge: 17 G Needle length: 9 cm and 9 Needle insertion depth: 8 cm Catheter type: closed end flexible Catheter size: 19 Gauge Catheter at skin depth: 14 cm Test dose: negative and Other  Assessment Events: blood not aspirated, injection not painful, no injection resistance, negative IV test and no paresthesia  Additional Notes Patient identified. Risks and benefits discussed including failed block, incomplete  Pain control, post dural puncture headache, nerve damage, paralysis, blood pressure Changes, nausea, vomiting, reactions to medications-both toxic and allergic and post Partum back pain. All questions were answered. Patient expressed understanding and wished to proceed. Sterile technique was used throughout procedure. Epidural site was Dressed with sterile barrier dressing. No paresthesias, signs of intravascular injection Or signs of intrathecal spread were encountered.  Patient was more comfortable after the epidural was dosed. Please see RN's note for documentation of vital signs and FHR which are stable.

## 2014-09-27 LAB — CBC
HCT: 33.8 % — ABNORMAL LOW (ref 36.0–46.0)
Hemoglobin: 11.3 g/dL — ABNORMAL LOW (ref 12.0–15.0)
MCH: 29.4 pg (ref 26.0–34.0)
MCHC: 33.4 g/dL (ref 30.0–36.0)
MCV: 88 fL (ref 78.0–100.0)
Platelets: 218 10*3/uL (ref 150–400)
RBC: 3.84 MIL/uL — ABNORMAL LOW (ref 3.87–5.11)
RDW: 14.7 % (ref 11.5–15.5)
WBC: 18.4 10*3/uL — AB (ref 4.0–10.5)

## 2014-09-27 NOTE — Progress Notes (Signed)
Patient ID: Barbara Yang, female   DOB: 07-02-1980, 34 y.o.   MRN: [id number] Postpartum day one Blood pressure 108/76 respiration 20 pulse 102 afebrile Abdomen soft uterus firm lochia moderate Legs negative patient is doing well

## 2014-09-27 NOTE — Lactation Note (Addendum)
This note was copied from the chart of Boy Doreene AdasJennifer Gugel. Lactation Consultation Note  Mother holding infant.  States infant breastfed off and on for appox 45 min. between getting put on phototherapy. Mother denies questions or concerns. Suggest she call if she needs assistance.  Mother agreed.   Patient Name: Boy Doreene AdasJennifer Bord Today's Date: 09/27/2014     Maternal Data    Feeding Feeding Type: Breast Fed Length of feed: 10 min  LATCH Score/Interventions Latch: Repeated attempts needed to sustain latch, nipple held in mouth throughout feeding, stimulation needed to elicit sucking reflex. Intervention(s): Skin to skin;Teach feeding cues;Waking techniques Intervention(s): Adjust position;Assist with latch;Breast compression  Audible Swallowing: A few with stimulation Intervention(s): Skin to skin;Hand expression Intervention(s): Skin to skin;Hand expression  Type of Nipple: Everted at rest and after stimulation Intervention(s): Hand pump  Comfort (Breast/Nipple): Soft / non-tender     Hold (Positioning): Assistance needed to correctly position infant at breast and maintain latch. Intervention(s): Breastfeeding basics reviewed;Support Pillows;Position options;Skin to skin  LATCH Score: 7  Lactation Tools Discussed/Used     Consult Status      Dahlia ByesBerkelhammer, Ruth [facility] 09/27/2014, 2:33 PM

## 2014-09-27 NOTE — Anesthesia Postprocedure Evaluation (Signed)
  Anesthesia Post-op Note  Patient: Barbara AdasJennifer Yang  Procedure(s) Performed: * No procedures listed *  Patient Location: PACU and Mother/Baby  Anesthesia Type:Epidural  Level of Consciousness: awake, alert  and oriented  Airway and Oxygen Therapy: Patient Spontanous Breathing  Post-op Pain: none  Post-op Assessment: Post-op Vital signs reviewed, Patient's Cardiovascular Status Stable, No headache, No backache, No residual numbness and No residual motor weakness  Post-op Vital Signs: Reviewed and stable  Complications: No apparent anesthesia complications

## 2014-09-27 NOTE — Lactation Note (Signed)
This note was copied from the chart of Barbara Doreene AdasJennifer Dimperio. Lactation Consultation Note Mom hasn't had BF experience. Baby sleepy and spitty. Has BF well 2 times since birth. Mom concerned at 757 hours old baby is sleeping and will not BF. Newborn behavior explained. STS encouraged. Mom and dad has been doing a lot of STS. Mom has semi flat nipples on exam, but rolls well in finger tips. Moms nipples evert well w/stimulation, has inversion in center of nipple. Hand expression taught, after a few minutes saw a drop of colostrum to end of nipple. Mom excited.  Tried to get baby to BF, but wasn't interested. Latched once for about 30 sec. Then pulled off and wouldn't relatch. Mom encouraged to feed baby 8-12 times/24 hours and with feeding cues. Mom encouraged to waken baby for feeds. Referred to Baby and Me Book in Breastfeeding section Pg. 22-23 for position options and Proper latch demonstration. Encouraged to call for assistance if needed and to verify proper latch. [facility] brochure given w/resources, support groups and LC services.  Patient Name: Barbara Yang VHQIO'NToday's Date: 09/27/2014 Reason for consult: Initial assessment   Maternal Data Has patient been taught Hand Expression?: Yes Does the patient have breastfeeding experience prior to this delivery?: No  Feeding Feeding Type: Breast Fed Length of feed: 0 min  LATCH Score/Interventions Latch: Too sleepy or reluctant, no latch achieved, no sucking elicited. Intervention(s): Skin to skin;Teach feeding cues;Waking techniques  Audible Swallowing: None Intervention(s): Alternate breast massage;Hand expression  Type of Nipple: Everted at rest and after stimulation  Comfort (Breast/Nipple): Soft / non-tender     Hold (Positioning): Assistance needed to correctly position infant at breast and maintain latch. Intervention(s): Breastfeeding basics reviewed;Support Pillows;Position options;Skin to skin  LATCH Score: 5  Lactation Tools  Discussed/Used     Consult Status Consult Status: Follow-up Date: 09/27/14 (in pm) Follow-up type: In-patient    [name], [name] 09/27/2014, 12:53 AM

## 2014-09-27 NOTE — Lactation Note (Signed)
This note was copied from the chart of Barbara Yang. Lactation Consultation Note  Patient Name: Barbara Doreene AdasJennifer [place] ZOXWR'UToday's Date: 09/27/2014 Reason for consult: Follow-up assessment   Follow-up at 30 hrs; Mom is a P1 with semiflat nipples that erect with stimulation but center of nipple has slight dimple.  Infant is on single photo-therapy. Mom wanted assistance with latching on left side (more difficult side) in cross-cradle. Infant has breastfed x12 (10-35 min) + attempts x4 (0) in past 24 hours; voids-3; stools-5 in 24 hours and life.   [facility] how to [facility] using asymmetrical latching technique and sandwiching of breast.  Taught dad how to assist using teacup hold and flanging bottom lip. Infant latched but tends to tuck bottom lip.  Infant had a wide latch with well-flanged lips; rhythmical sucking; few swallows heard.  LS-8. Encouraged mom to keep feeding with cues.  Reviewed cluster feeding. Encouraged to call with next latch as needed.  Mom asked if someone could help her again.     Maternal Data    Feeding Feeding Type: Breast Fed Length of feed: 10 min  LATCH Score/Interventions Latch: Grasps breast easily, tongue down, lips flanged, rhythmical sucking.  Audible Swallowing: A few with stimulation  Type of Nipple: Everted at rest and after stimulation (evert with stimulation )  Comfort (Breast/Nipple): Soft / non-tender     Hold (Positioning): Assistance needed to correctly position infant at breast and maintain latch. Intervention(s): Breastfeeding basics reviewed;Support Pillows;Skin to skin;Position options  LATCH Score: 8  Lactation Tools Discussed/Used     Consult Status Consult Status: Follow-up Date: 09/28/14 Follow-up type: In-patient    Lendon KaVann, Marilyn Nihiser Walker 09/27/2014, 11:25 PM

## 2014-09-28 NOTE — Discharge Instructions (Signed)
Discharge instructions   You can wash your hair  Shower  Eat what you want  Drink what you want  See me in 6 weeks  Your ankles are going to swell more in the next 2 weeks than when pregnant  No sex for 6 weeks   Zoran Yankee A, MD 09/28/2014

## 2014-09-28 NOTE — Discharge Summary (Signed)
Obstetric Discharge Summary Reason for Admission: rupture of membranes Prenatal Procedures: none Intrapartum Procedures: spontaneous vaginal delivery Postpartum Procedures: none Complications-Operative and Postpartum: none HEMOGLOBIN  Date Value Ref Range Status  09/27/2014 11.3* 12.0 - 15.0 g/dL Final   HCT  Date Value Ref Range Status  09/27/2014 33.8* 36.0 - 46.0 % Final    Physical Exam:  General: alert Lochia: appropriate Uterine Fundus: firm Incision: healing well DVT Evaluation: No evidence of DVT seen on physical exam.  Discharge Diagnoses: Term Pregnancy-delivered  Discharge Information: Date: 09/28/2014 Activity: pelvic rest Diet: routine Medications: Percocet Condition: improved Instructions: refer to practice specific booklet Discharge to: home Follow-up Information    Follow up with Kathreen CosierMARSHALL,BERNARD A, MD.   Specialty:  Obstetrics and Gynecology   Contact information:   [address] [place] [address] [phone]       Newborn Data: Live born female  Birth Weight: 6 lb 7.2 oz (2926 g) APGAR: 9, 9  Home with mother.  MARSHALL,BERNARD A 09/28/2014, 6:39 AM

## 2014-09-28 NOTE — Progress Notes (Signed)
Patient ID: Barbara Yang, female   DOB: 02-19-1981, 34 y.o.   MRN: [id number] Postpartum day 2 Vital signs normal blood pressure 100/67 respiration 18 pulse 88 Fundus firm Lochia moderate Legs negative doing well home today

## 2014-09-28 NOTE — Lactation Note (Signed)
This note was copied from the chart of Boy Doreene AdasJennifer Kundinger. Lactation Consultation Note  Follow up visit made prior to discharge.  Mom is currently feeding baby using cross cradle hold.  Mom's breasts are filling,  Reviewed hand expression and transitional milk seen. Reviewed breastfeeding basics and discharge instructions.  Mom asking good questions which were answered.  Instructed to feed with any hunger cue and to expect cluster feeding today.  Outpatient lactation services and support reviewed and encouraged.  Patient Name: Boy Doreene AdasJennifer Reveles ZOXWR'UToday's Date: 09/28/2014 Reason for consult: Follow-up assessment   Maternal Data    Feeding Feeding Type: Breast Fed Length of feed: 20 min  LATCH Score/Interventions Latch: Grasps breast easily, tongue down, lips flanged, rhythmical sucking. Intervention(s): Skin to skin;Teach feeding cues;Waking techniques Intervention(s): Breast compression;Breast massage;Assist with latch;Adjust position  Audible Swallowing: A few with stimulation Intervention(s): Skin to skin;Hand expression;Alternate breast massage  Type of Nipple: Everted at rest and after stimulation  Comfort (Breast/Nipple): Soft / non-tender     Hold (Positioning): No assistance needed to correctly position infant at breast. Intervention(s): Breastfeeding basics reviewed;Support Pillows;Position options;Skin to skin  LATCH Score: 9  Lactation Tools Discussed/Used     Consult Status Consult Status: Complete    Huston FoleyMOULDEN, Adah Stoneberg S 09/28/2014, 11:44 AM

## 2015-09-05 LAB — PROCEDURE REPORT - SCANNED

## 2015-10-30 ENCOUNTER — Ambulatory Visit (HOSPITAL_COMMUNITY)
Admission: EM | Admit: 2015-10-30 | Discharge: 2015-10-30 | Disposition: A | Discharge status: Home / Self Care | Payer: 59 | Attending: Family Medicine | Admitting: Family Medicine

## 2015-10-30 ENCOUNTER — Encounter (HOSPITAL_COMMUNITY): Payer: Self-pay | Admitting: Emergency Medicine

## 2015-10-30 DIAGNOSIS — J0100 Acute maxillary sinusitis, unspecified: Secondary | ICD-10-CM

## 2015-10-30 DIAGNOSIS — J069 Acute upper respiratory infection, unspecified: Secondary | ICD-10-CM

## 2015-10-30 DIAGNOSIS — R0982 Postnasal drip: Secondary | ICD-10-CM

## 2015-10-30 MED ORDER — AMOXICILLIN 500 MG PO CAPS: 1000.0000 mg | ORAL_CAPSULE | Freq: Two times a day (BID) | ORAL | 0 refills | Status: DC

## 2015-10-30 MED ORDER — PREDNISONE 20 MG PO TABS: ORAL_TABLET | ORAL | 0 refills | Status: DC

## 2015-10-30 NOTE — ED Provider Notes (Signed)
CSN: [id number]     Arrival date & time 10/30/15  1835 History   First MD Initiated Contact with Patient 10/30/15 2016     Chief Complaint  Patient presents with  . URI   (Consider location/radiation/quality/duration/timing/severity/associated sxs/prior Treatment) 35 year old female complaining of left facial pain radiating to the teeth. She is has a runny nose. Uncertain about fever. She has had upper respiratory symptoms for approximately a week. The last 2 days she has developed facial pain and congestion. She is taking Sudafed PE along with Tylenol Sinus medication.      Past Medical History:  Diagnosis Date  . Cough    non productive  . Heartburn during pregnancy    takes Tums daily  . Medical history non-contributory    Past Surgical History:  Procedure Laterality Date  . FOOT SURGERY    . HARDWARE REMOVAL Left 09/09/2014   Procedure: HARDWARE REMOVAL LEFT FOOT ;  Surgeon: Myrene Galas, MD;  Location: [facility] OR;  Service: Orthopedics;  Laterality: Left;  . ORIF ANKLE FRACTURE Left 04/22/2014   Procedure: OPEN REDUCTION INTERNAL FIXATION (ORIF) LEFT LISFRANC FRACTURE;  Surgeon: Budd Palmer, MD;  Location: [facility] OR;  Service: Orthopedics;  Laterality: Left;  . UMBILICAL HERNIA REPAIR     Family History  Problem Relation Age of Onset  . Cancer Mother    Social History  Substance Use Topics  . Smoking status: Never Smoker  . Smokeless tobacco: Never Used  . Alcohol use No   OB History    Gravida Para Term Preterm AB Living   0 4 1   SAB TAB Ectopic Multiple Live Births   4 0 0 0 1     Review of Systems  Constitutional: Negative for activity change, appetite change, chills, fatigue and fever.  HENT: Positive for congestion, postnasal drip, rhinorrhea and sinus pressure. Negative for facial swelling, sore throat and trouble swallowing.   Eyes: Negative.   Respiratory: Negative.  Negative for cough and shortness of breath.   Cardiovascular: Negative.    Musculoskeletal: Negative for neck pain and neck stiffness.  Skin: Negative for pallor and rash.  Neurological: Negative.   All other systems reviewed and are negative.   Allergies  Review of patient's allergies indicates no known allergies.  Home Medications   Prior to Admission medications   Medication Sig Start Date End Date Taking? Authorizing Provider  Diphenhydramine-PE-APAP (SUDAFED PE SEVERE COLD PO) Take by mouth.   Yes Historical Provider, MD  Phenylephrine-APAP-Guaifenesin (TYLENOL SINUS SEVERE) 5-325-200 MG TABS Take by mouth.   Yes Historical Provider, MD  amoxicillin (AMOXIL) 500 MG capsule Take 2 capsules (1,000 mg total) by mouth 2 (two) times daily. 10/30/15   Hayden Rasmussen, NP  predniSONE (DELTASONE) 20 MG tablet Take 3 tabs po on first day, 2 tabs second day, 2 tabs third day, 1 tab fourth day, 1 tab 5th day. Take with food. 10/30/15   Hayden Rasmussen, NP   Meds Ordered and Administered this Visit  Medications - No data to display  BP 118/80 (BP Location: Right Arm) Comment (BP Location): large cuff  Pulse 87   Temp 99.3 F (37.4 C) (Oral)   Resp 16   LMP 10/23/2015   SpO2 99%  No data found.   Physical Exam  Constitutional: She is oriented to person, place, and time. She appears well-developed and well-nourished. No distress.  HENT:  Head: Normocephalic and atraumatic.  Mouth/Throat: No oropharyngeal exudate.  Oropharynx with minor erythema and  copious amount of clear frothy PND. Bilateral TMs with minor retraction. Positive for left paranasal and maxillary sinus tenderness  Neck: Normal range of motion. Neck supple.  Cardiovascular: Normal rate, regular rhythm and normal heart sounds.   Pulmonary/Chest: Effort normal and breath sounds normal. No respiratory distress.  Musculoskeletal: Normal range of motion. She exhibits no edema.  Lymphadenopathy:    She has no cervical adenopathy.  Neurological: She is alert and oriented to person, place, and time.  Skin:  Skin is warm and dry. No rash noted.  Psychiatric: She has a normal mood and affect.  Nursing note and vitals reviewed.   Urgent Care Course   Clinical Course    Procedures (including critical care time)  Labs Review Labs Reviewed - No data to display  Imaging Review No results found.   Visual Acuity Review  Right Eye Distance:   Left Eye Distance:   Bilateral Distance:    Right Eye Near:   Left Eye Near:    Bilateral Near:         MDM   1. Acute maxillary sinusitis, recurrence not specified   2. PND (post-nasal drip)   3. URI (upper respiratory infection)    For nasal and head congestion may take Pseudoephedrine (must sign for this) every 4 hours as needed. Lots of Saline nasal spray used frequently. For drainage may use Allegra, Claritin or Zyrtec. If you need stronger medicine to stop drainage may take Chlor-Trimeton 2-4 mg every 4 hours. This may cause drowsiness. Ibuprofen 600 mg every 6 hours as needed for pain, discomfort or fever. Drink plenty of fluids and stay well-hydrated. Meds ordered this encounter  Medications  . Diphenhydramine-PE-APAP (SUDAFED PE SEVERE COLD PO)    Sig: Take by mouth.  . Phenylephrine-APAP-Guaifenesin (TYLENOL SINUS SEVERE) 5-325-200 MG TABS    Sig: Take by mouth.  . predniSONE (DELTASONE) 20 MG tablet    Sig: Take 3 tabs po on first day, 2 tabs second day, 2 tabs third day, 1 tab fourth day, 1 tab 5th day. Take with food.    Dispense:  9 tablet    Refill:  0    Order Specific Question:   Supervising Provider    Answer:   Tyrone NineGRUNZ, RYAN B [id number]]  . amoxicillin (AMOXIL) 500 MG capsule    Sig: Take 2 capsules (1,000 mg total) by mouth 2 (two) times daily.    Dispense:  30 capsule    Refill:  0    Order Specific Question:   Supervising Provider    Answer:   Tyrone NineGRUNZ, RYAN B [id number]]   Last 2 meds only, Rx'd.    Hayden Rasmussenavid Caeleigh Prohaska, NP 10/30/15 2037

## 2015-10-30 NOTE — ED Triage Notes (Signed)
Patient reports onset of symptoms Friday.  Patient has left facial pain, left side of head hurting, left of mouth with teeth hurting.  Blowing yellow-green from sinus.  Temp today 99.3

## 2015-12-10 ENCOUNTER — Encounter: Payer: Self-pay | Admitting: *Deleted

## 2016-01-03 ENCOUNTER — Ambulatory Visit (INDEPENDENT_AMBULATORY_CARE_PROVIDER_SITE_OTHER): Payer: 59 | Admitting: Podiatry

## 2016-01-03 ENCOUNTER — Encounter: Payer: Self-pay | Admitting: Podiatry

## 2016-01-03 VITALS — BP 101/69 | HR 86 | Ht 62.0 in | Wt 227.0 lb

## 2016-01-03 DIAGNOSIS — L853 Xerosis cutis: Secondary | ICD-10-CM | POA: Diagnosis not present

## 2016-01-03 DIAGNOSIS — B353 Tinea pedis: Secondary | ICD-10-CM

## 2016-01-03 MED ORDER — TERBINAFINE HCL 1 % EX CREA: 1.0000 "application " | TOPICAL_CREAM | Freq: Two times a day (BID) | CUTANEOUS | 0 refills | Status: DC

## 2016-01-03 NOTE — Progress Notes (Signed)
SUBJECTIVE: 35 y.o. year old female presents complaining of dry skin on bottom of left foot duration for many years. Patient has been peeling and picking on them. One spot has skin ripped off.  REVIEW OF SYSTEMS: Pertinent items noted in HPI and remainder of comprehensive ROS otherwise negative.  OBJECTIVE: DERMATOLOGIC EXAMINATION: Dry peeling skin plantar left foot. Xerotic skin both feet.  VASCULAR EXAMINATION OF LOWER LIMBS: All pedal pulses are palpable with normal pulsation.  Capillary Filling times within 3 seconds in all digits.  No edema or erythema noted.  NEUROLOGIC EXAMINATION OF THE LOWER LIMBS: All epicritic and tactile sensations grossly intact.  MUSCULOSKELETAL EXAMINATION: No gross deformities.  ASSESSMENT: Tinea pedis left foot. Xerosis of skin bilateral.   PLAN: Reviewed clinical findings and available treatment options. Instructed to do daily scrubbing after each shower using Medicated shampoo and scrub pad. Rx Lamisil to apply each morning to left foot. Return in one month to check on progress.

## 2016-01-03 NOTE — Patient Instructions (Signed)
Seen for dry peeling skin on left foot. Possible fungal infection on left foot.  Do daily scrub at the end of shower using Salsun Blue Dandruff Shampoo and scrub pad. In the morning apply Lamisil cream on left foot. Return in one month.

## 2016-02-02 ENCOUNTER — Ambulatory Visit: Payer: 59 | Admitting: Podiatry

## 2016-02-07 ENCOUNTER — Ambulatory Visit: Payer: 59 | Admitting: Podiatry

## 2016-03-24 ENCOUNTER — Ambulatory Visit (HOSPITAL_COMMUNITY)
Admission: EM | Admit: 2016-03-24 | Discharge: 2016-03-24 | Disposition: A | Discharge status: Home / Self Care | Payer: 59 | Attending: Family Medicine | Admitting: Family Medicine

## 2016-03-24 ENCOUNTER — Encounter (HOSPITAL_COMMUNITY): Payer: Self-pay | Admitting: Emergency Medicine

## 2016-03-24 DIAGNOSIS — J0111 Acute recurrent frontal sinusitis: Secondary | ICD-10-CM | POA: Diagnosis not present

## 2016-03-24 MED ORDER — AMOXICILLIN 500 MG PO CAPS: 500.0000 mg | ORAL_CAPSULE | Freq: Three times a day (TID) | ORAL | 0 refills | Status: DC

## 2016-03-24 MED ORDER — PREDNISONE 5 MG (21) PO TBPK: 5.0000 mg | ORAL_TABLET | Freq: Every day | ORAL | 0 refills | Status: DC

## 2016-03-24 NOTE — Discharge Instructions (Signed)
With only 2-3 days of symptoms your infection is most likely viral. Would take the prednisone to help with inflammation from your sinus, but would hold the antibiotic RX for up to 5 days to see if you really need this. If feeling better then do not take as this increase antibiotic resistance. Drink a lot of water and may use Sudafed (must sign for this behind counter) which will help tremendously.

## 2016-03-24 NOTE — ED Provider Notes (Signed)
CSN: [id number]     Arrival date & time 03/24/16  1746 History   First MD Initiated Contact with Patient 03/24/16 1819     Chief Complaint  Patient presents with  . URI   (Consider location/radiation/quality/duration/timing/severity/associated sxs/prior Treatment) 36 yo black female presents with 3 days of sinus congestion and right sided sinus pain. Mild non-productive cough. No fevers. She stated that in august she had the same type symptoms and would like same prescriptions if possible. She has some malaise.       Past Medical History:  Diagnosis Date  . Cough    non productive  . Heartburn during pregnancy    takes Tums daily  . Medical history non-contributory    Past Surgical History:  Procedure Laterality Date  . FOOT SURGERY    . HARDWARE REMOVAL Left 09/09/2014   Procedure: HARDWARE REMOVAL LEFT FOOT ;  Surgeon: Myrene Galas, MD;  Location: [facility] OR;  Service: Orthopedics;  Laterality: Left;  . ORIF ANKLE FRACTURE Left 04/22/2014   Procedure: OPEN REDUCTION INTERNAL FIXATION (ORIF) LEFT LISFRANC FRACTURE;  Surgeon: Budd Palmer, MD;  Location: [facility] OR;  Service: Orthopedics;  Laterality: Left;  . UMBILICAL HERNIA REPAIR     Family History  Problem Relation Age of Onset  . Cancer Mother    Social History  Substance Use Topics  . Smoking status: Never Smoker  . Smokeless tobacco: Never Used  . Alcohol use No   OB History    Gravida Para Term Preterm AB Living   7 2 2  0 4 1   SAB TAB Ectopic Multiple Live Births   4 0 0 0 1     Review of Systems  Constitutional: Positive for fatigue. Negative for fever.  HENT: Positive for congestion, postnasal drip, sinus pain and sinus pressure.   Eyes: Negative.   Respiratory: Positive for cough. Negative for chest tightness and shortness of breath.   Skin: Negative.     Allergies  Patient has no known allergies.  Home Medications   Prior to Admission medications   Medication Sig Start Date End Date Taking?  Authorizing Provider  amoxicillin (AMOXIL) 500 MG capsule Take 1 capsule (500 mg total) by mouth 3 (three) times daily. 03/24/16   Riki Sheer, PA-C  predniSONE (STERAPRED UNI-PAK 21 TAB) 5 MG (21) TBPK tablet Take 1 tablet (5 mg total) by mouth daily. 03/24/16   Riki Sheer, PA-C  terbinafine (LAMISIL) 1 % cream Apply 1 application topically 2 (two) times daily. Patient not taking: Reported on 03/24/2016 01/03/16   Myeong Christianne Dolin, DPM   Meds Ordered and Administered this Visit  Medications - No data to display  BP 133/76 (BP Location: Left Arm)   Pulse 84   Temp 98.6 F (37 C) (Oral)   Resp 22   SpO2 97%  No data found.   Physical Exam  Urgent Care Course   Clinical Course     Procedures (including critical care time)  Labs Review Labs Reviewed - No data to display  Imaging Review No results found.   Visual Acuity Review  Right Eye Distance:   Left Eye Distance:   Bilateral Distance:    Right Eye Near:   Left Eye Near:    Bilateral Near:         MDM   1. Acute recurrent frontal sinusitis    She was given Amox and prednisone at visit in August and I did provide those RX per her request, however  she was counseled regarding use of ABX therapy if this is a virus. Therefore asked her to hold the RX x 5 days and ok to only use prednisone. If the symptoms remain after than then appropriate to use abx. Stay hydrated and use nasal rinses.     Riki SheerMichelle G Young, PA-C 03/24/16 1842

## 2016-03-24 NOTE — ED Triage Notes (Signed)
Onset Thursday of symptoms.  Patient has a cough, denies fever.

## 2016-07-14 IMAGING — CR DG FOOT COMPLETE 3+V*L*
3 series · 3 of 3 positions shown · non-contrast
Comparison: None.

CLINICAL DATA: Status post fall off stool, with left foot pain.
Initial encounter.

EXAM:
LEFT FOOT - COMPLETE 3+ VIEW

[foot ap]
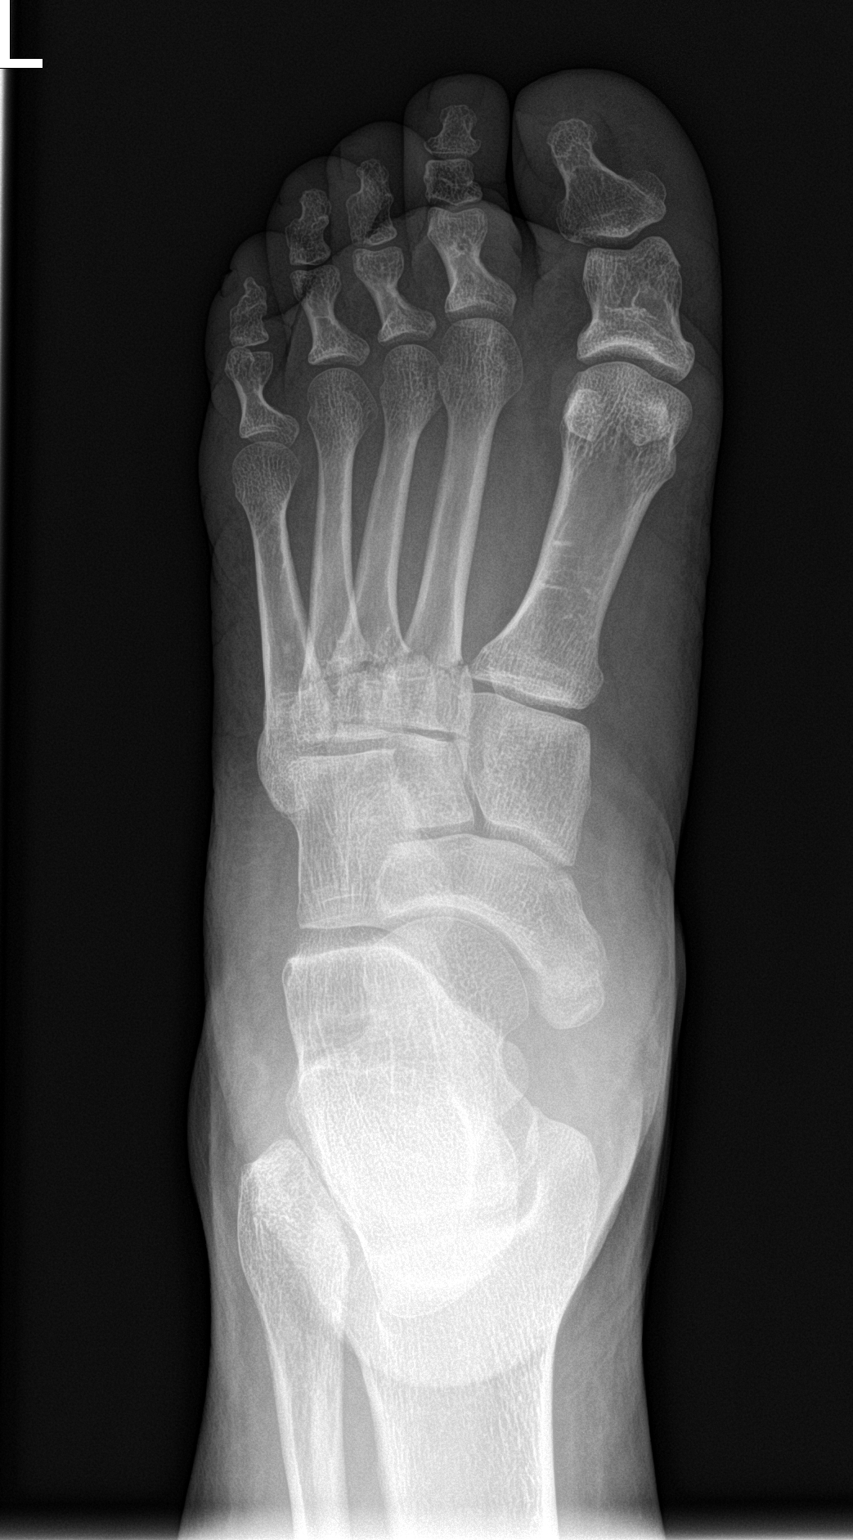

[foot obl]
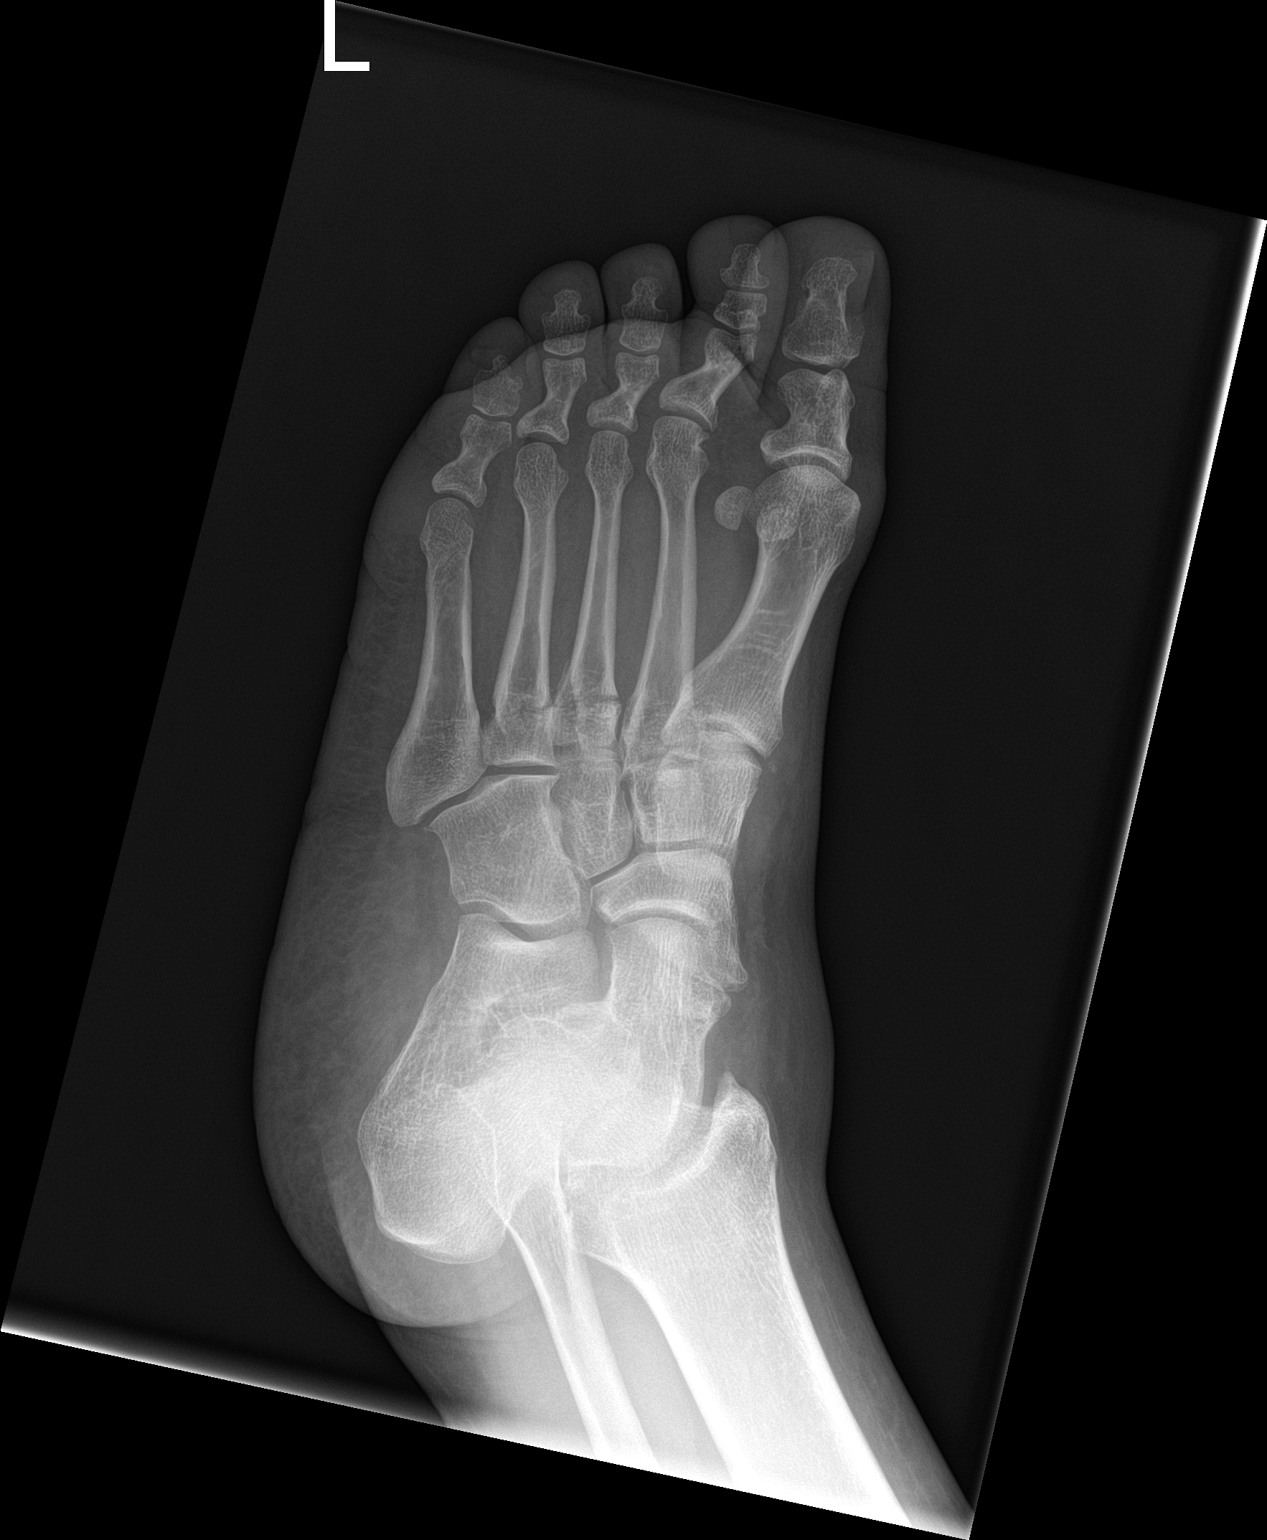

[foot lat]
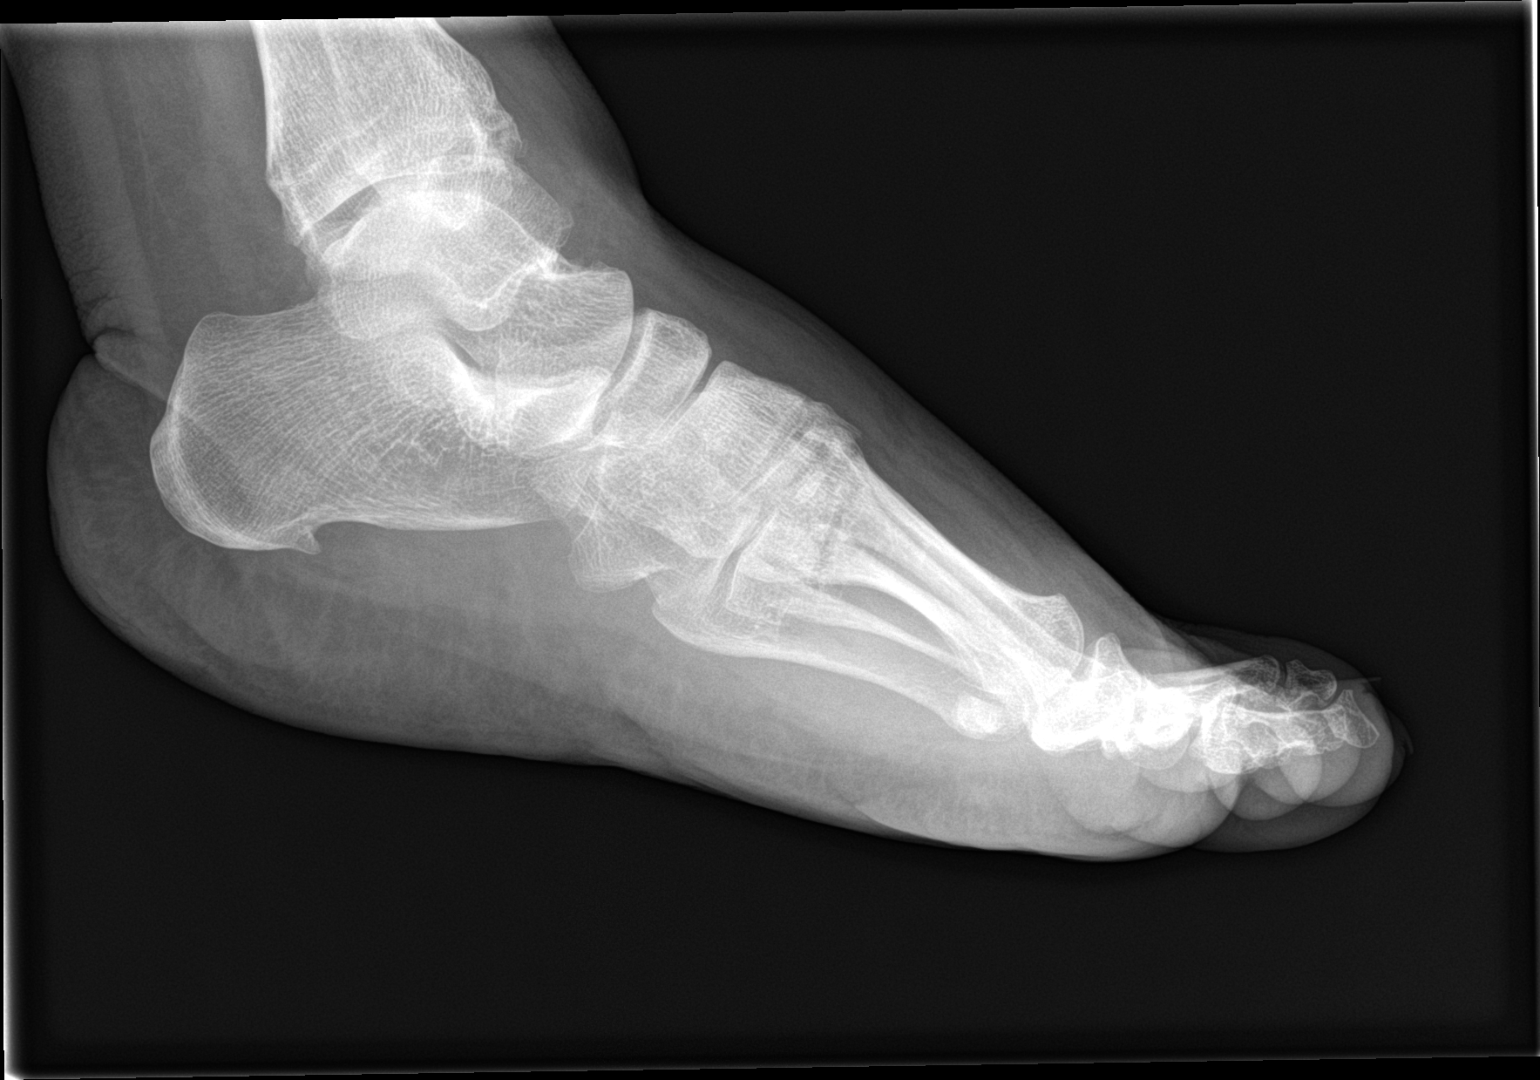

[3 of 3 positions shown; findings below may reference images not displayed]

FINDINGS: There are minimally displaced horizontal fractures extending across
the bases of the second through fourth metatarsals, with widening
between the bases of the first and second metatarsals, compatible
with mild Lisfranc injury. There may also be a fracture through the
base of the fifth metatarsal, though this is not well characterized.
No additional fractures are seen. Visualized joint spaces are
otherwise preserved.

A large os naviculare is noted. Soft tissue swelling is noted
diffusely about the foot. A small plantar calcaneal spur is seen.
Osteophytes are seen arising at the anterior aspect of the distal
tibia.
IMPRESSION: 1. Minimally displaced horizontal fractures extending across the
bases of the second through fourth metatarsals, with associated mild
Lisfranc injury. Question of fracture through the base of the fifth
metatarsal, though this is not well characterized. Would correlate
for associated focal fifth metatarsal symptoms.
2. Large os naviculare noted.

## 2016-07-14 IMAGING — CR DG FOOT COMPLETE 3+V*L*
3 series · 3 of 3 positions shown · non-contrast
Comparison: 04/22/2014.

CLINICAL DATA: Lisfranc dislocation.  Postop.

EXAM:
LEFT FOOT - COMPLETE 3+ VIEW

[AP]
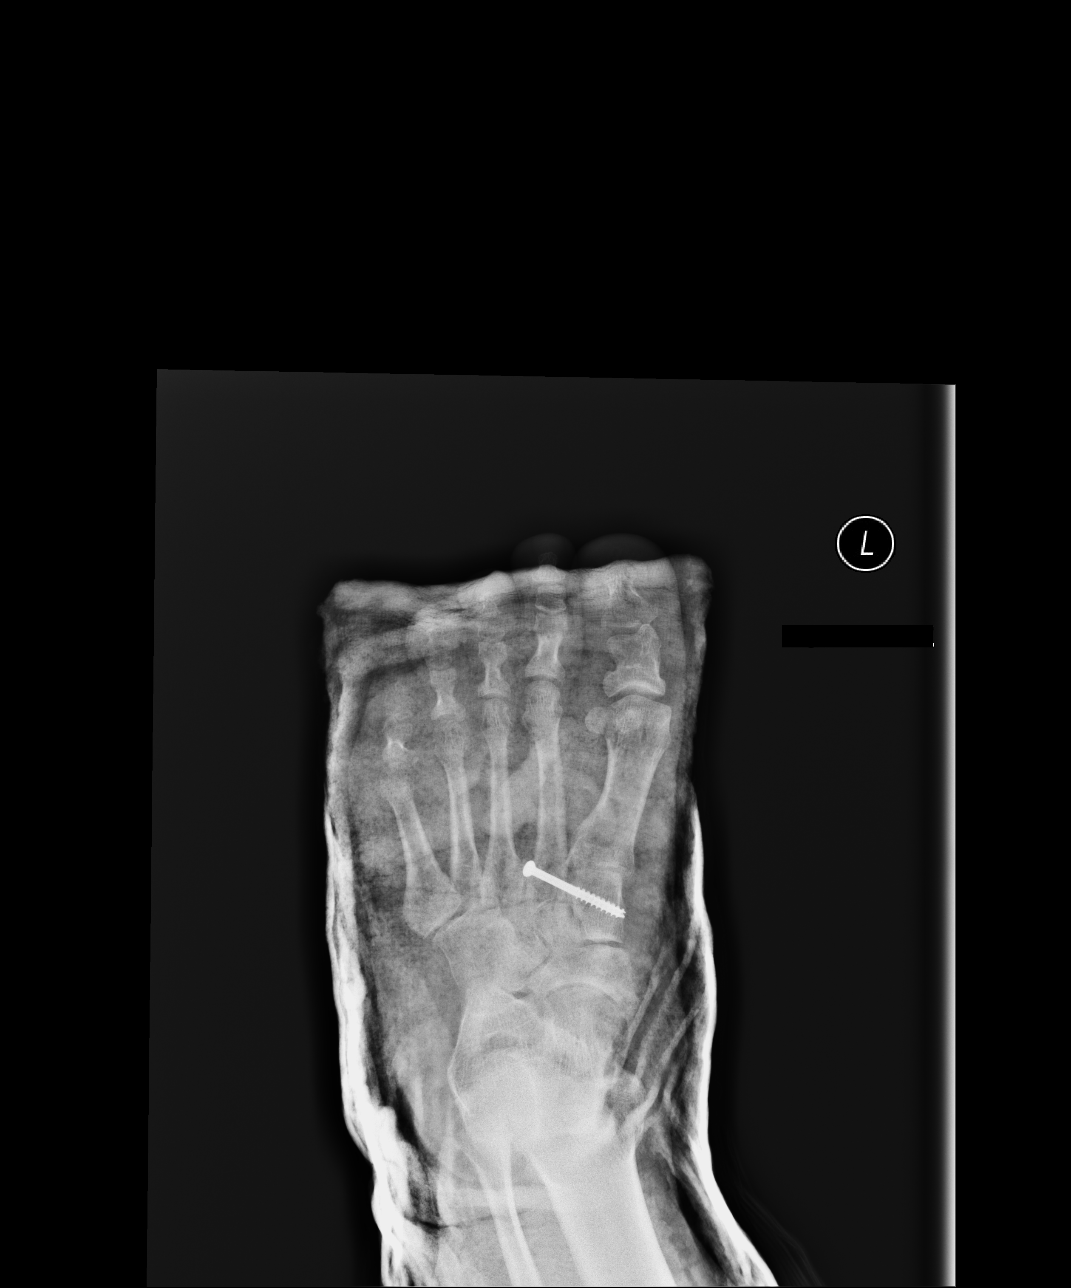

[ap obl int rot]
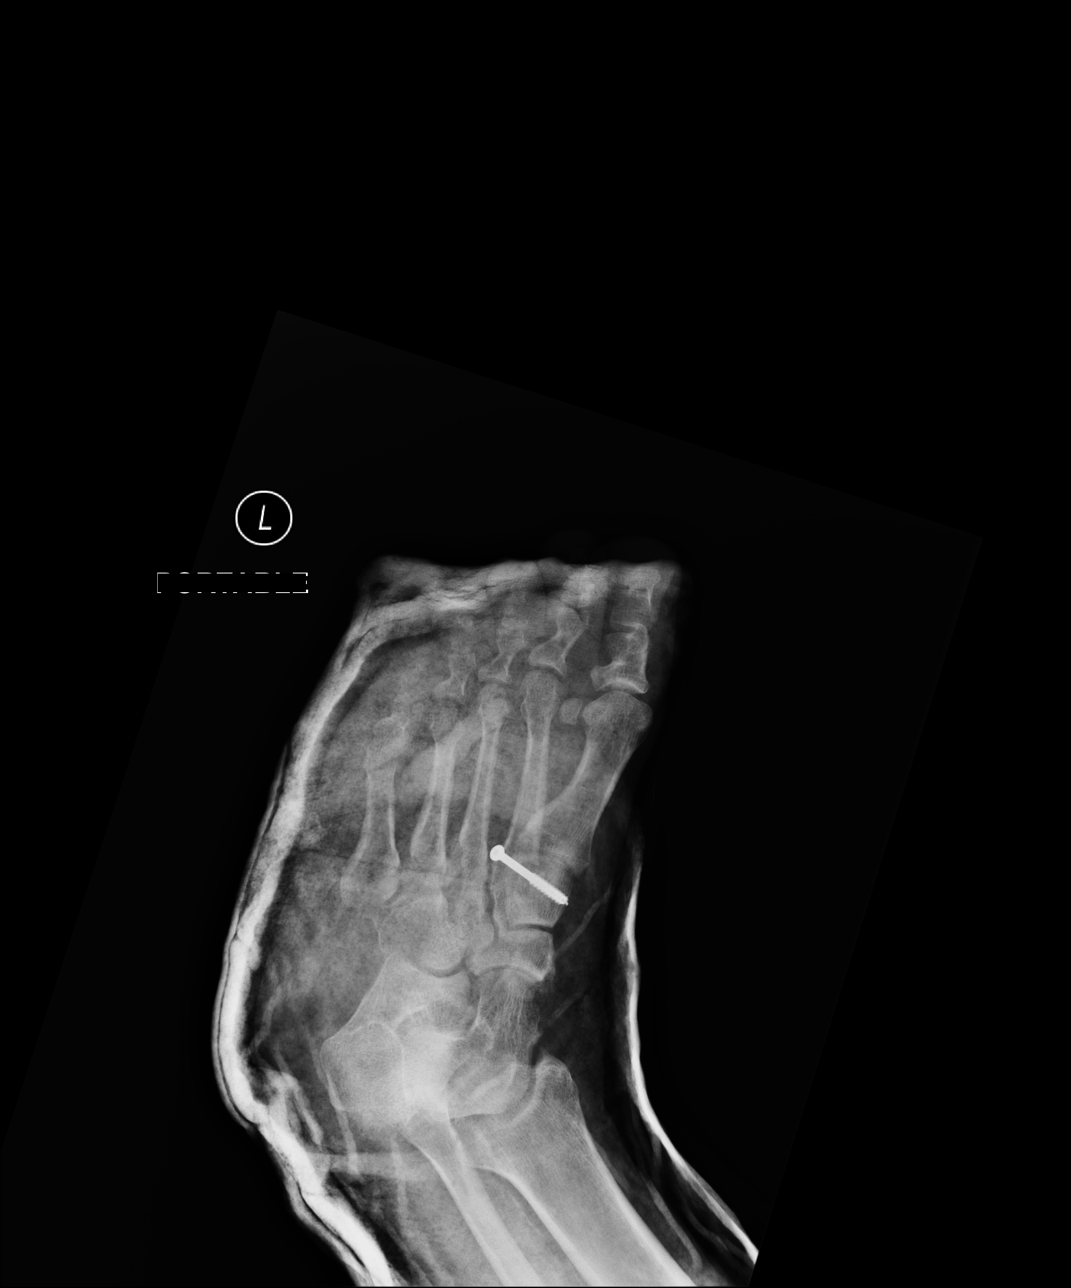

[lateral]
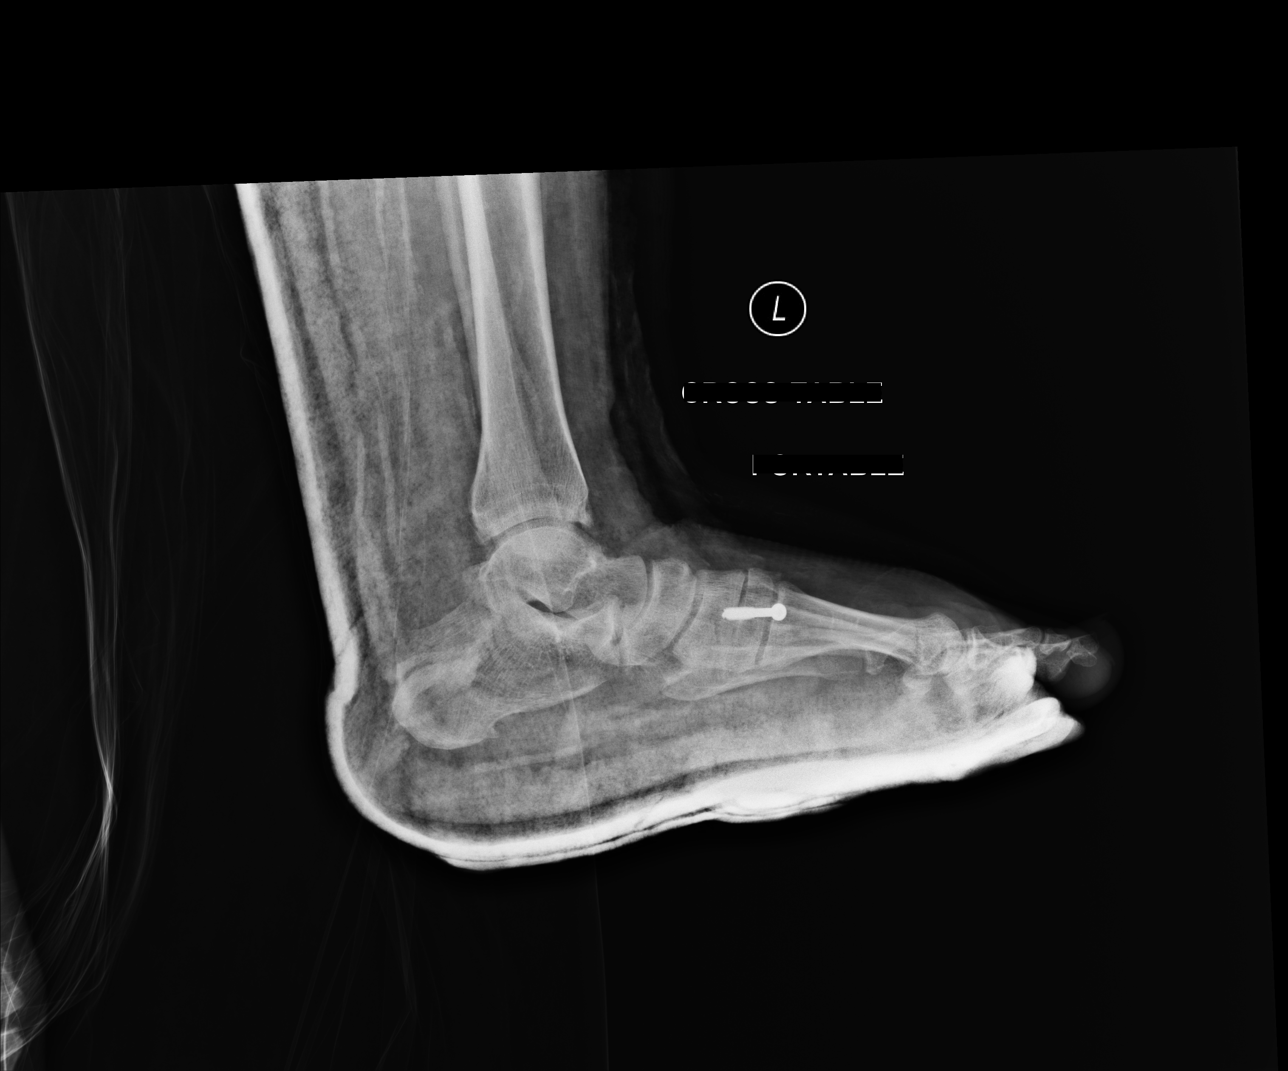

[3 of 3 positions shown; findings below may reference images not displayed]

FINDINGS: Exam detail is diminished due to overlying casting material. There
has been interval placement of a cannulated screw across the left
second metatarsal base into the medial cuneiform. Fractures
involving the base of the second, third, fourth metatarsal bones are
again noted. Alignment appears anatomic.
IMPRESSION: 1. Status post open reduction and internal fixation of Lisfranc
fracture dislocation.

## 2016-09-26 ENCOUNTER — Ambulatory Visit (HOSPITAL_COMMUNITY)
Admission: EM | Admit: 2016-09-26 | Discharge: 2016-09-26 | Disposition: A | Discharge status: Home / Self Care | Payer: 59 | Attending: Family Medicine | Admitting: Family Medicine

## 2016-09-26 ENCOUNTER — Encounter (HOSPITAL_COMMUNITY): Payer: Self-pay | Admitting: Family Medicine

## 2016-09-26 DIAGNOSIS — B373 Candidiasis of vulva and vagina: Secondary | ICD-10-CM | POA: Insufficient documentation

## 2016-09-26 DIAGNOSIS — N898 Other specified noninflammatory disorders of vagina: Secondary | ICD-10-CM

## 2016-09-26 DIAGNOSIS — B3731 Acute candidiasis of vulva and vagina: Secondary | ICD-10-CM

## 2016-09-26 DIAGNOSIS — L298 Other pruritus: Secondary | ICD-10-CM | POA: Diagnosis present

## 2016-09-26 MED ORDER — MICONAZOLE NITRATE 200 MG VA SUPP: 200.0000 mg | Freq: Every day | VAGINAL | 0 refills | Status: DC

## 2016-09-26 MED ORDER — FLUCONAZOLE 150 MG PO TABS: 150.0000 mg | ORAL_TABLET | Freq: Every day | ORAL | 0 refills | Status: DC

## 2016-09-26 NOTE — ED Triage Notes (Signed)
Pt here for vaginal itching and irritation. sts that she has been using cream but worse.

## 2016-09-26 NOTE — ED Provider Notes (Signed)
CSN: [id number]     Arrival date & time 09/26/16  1208 History   First MD Initiated Contact with Patient 09/26/16 1256     Chief Complaint  Patient presents with  . Vaginal Itching   (Consider location/radiation/quality/duration/timing/severity/associated sxs/prior Treatment) Patient c/o vaginal itching and irritation.  She states she has a vaginal yeast infection.   The history is provided by the patient.  Vaginal Itching  This is a new problem. The problem occurs constantly.    Past Medical History:  Diagnosis Date  . Cough    non productive  . Heartburn during pregnancy    takes Tums daily  . Medical history non-contributory    Past Surgical History:  Procedure Laterality Date  . FOOT SURGERY    . HARDWARE REMOVAL Left 09/09/2014   Procedure: HARDWARE REMOVAL LEFT FOOT ;  Surgeon: Myrene GalasMichael Handy, MD;  [facility];  Service: Orthopedics;  Laterality: Left;  . ORIF ANKLE FRACTURE Left 04/22/2014   Procedure: OPEN REDUCTION INTERNAL FIXATION (ORIF) LEFT LISFRANC FRACTURE;  Surgeon: Budd PalmerMichael H Handy, MD;  Location: [facility] OR;  Service: Orthopedics;  Laterality: Left;  . UMBILICAL HERNIA REPAIR     Family History  Problem Relation Age of Onset  . Cancer Mother    Social History  Substance Use Topics  . Smoking status: Never Smoker  . Smokeless tobacco: Never Used  . Alcohol use No   OB History    Gravida Para Term Preterm AB Living   7 2 2  0 4 1   SAB TAB Ectopic Multiple Live Births   4 0 0 0 1     Review of Systems  Constitutional: Negative.   HENT: Negative.   Eyes: Negative.   Respiratory: Negative.   Cardiovascular: Negative.   Gastrointestinal: Negative.   Endocrine: Negative.   Genitourinary: Positive for vaginal discharge.  Musculoskeletal: Negative.   Allergic/Immunologic: Negative.   Neurological: Negative.   Hematological: Negative.   Psychiatric/Behavioral: Negative.     Allergies  Patient has no known allergies.  Home Medications   Prior to  Admission medications   Medication Sig Start Date End Date Taking? Authorizing Provider  amoxicillin (AMOXIL) 500 MG capsule Take 1 capsule (500 mg total) by mouth 3 (three) times daily. 03/24/16   Riki SheerYoung, Michelle G, PA-C  fluconazole (DIFLUCAN) 150 MG tablet Take 1 tablet (150 mg total) by mouth daily. 09/26/16   Deatra Canterxford, Araceli Arango J, FNP  miconazole (MICOTIN) 200 MG vaginal suppository Place 1 suppository (200 mg total) vaginally at bedtime. 09/26/16   Deatra Canterxford, Kayvon Mo J, FNP  predniSONE (STERAPRED UNI-PAK 21 TAB) 5 MG (21) TBPK tablet Take 1 tablet (5 mg total) by mouth daily. 03/24/16   Riki SheerYoung, Michelle G, PA-C  terbinafine (LAMISIL) 1 % cream Apply 1 application topically 2 (two) times daily. Patient not taking: Reported on 03/24/2016 01/03/16   Charlett NoseSheard, Myeong O, DPM   Meds Ordered and Administered this Visit  Medications - No data to display  BP 97/64   Pulse 78   Temp 98.1 F (36.7 C)   Resp 18   SpO2 100%  No data found.   Physical Exam  Constitutional: She appears well-developed and well-nourished.  HENT:  Head: Normocephalic and atraumatic.  Eyes: Conjunctivae and EOM are normal. Pupils are equal, round, and reactive to light.  Neck: Normal range of motion. Neck supple.  Cardiovascular: Normal rate, regular rhythm and normal heart sounds.   Pulmonary/Chest: Effort normal and breath sounds normal.  Nursing note and vitals reviewed.  Urgent Care Course     Procedures (including critical care time)  Labs Review Labs Reviewed  CERVICOVAGINAL ANCILLARY ONLY    Imaging Review No results found.   Visual Acuity Review  Right Eye Distance:   Left Eye Distance:   Bilateral Distance:    Right Eye Near:   Left Eye Near:    Bilateral Near:         MDM   1. Vaginal discharge   2. Vaginal candidiasis    Diflucan 150mg  one po qd #2 Miconazole 200mg  suppository qhs#3  Vaginal Cytology - GC/ Chlamydia Trich bvag cvag      Deatra Canter, FNP 09/26/16 1310

## 2016-09-27 LAB — CERVICOVAGINAL ANCILLARY ONLY
Bacterial vaginitis: NEGATIVE
Candida vaginitis: NEGATIVE
Chlamydia: NEGATIVE
Neisseria Gonorrhea: NEGATIVE
Trichomonas: NEGATIVE

## 2017-04-26 ENCOUNTER — Encounter (HOSPITAL_COMMUNITY): Payer: Self-pay | Admitting: Emergency Medicine

## 2017-04-26 ENCOUNTER — Emergency Department (HOSPITAL_COMMUNITY)
Admission: EM | Admit: 2017-04-26 | Discharge: 2017-04-26 | Disposition: A | Discharge status: Home / Self Care | Payer: 59 | Attending: Emergency Medicine | Admitting: Emergency Medicine

## 2017-04-26 ENCOUNTER — Other Ambulatory Visit: Payer: Self-pay

## 2017-04-26 DIAGNOSIS — Y939 Activity, unspecified: Secondary | ICD-10-CM | POA: Insufficient documentation

## 2017-04-26 DIAGNOSIS — Y929 Unspecified place or not applicable: Secondary | ICD-10-CM | POA: Diagnosis not present

## 2017-04-26 DIAGNOSIS — S51851A Open bite of right forearm, initial encounter: Secondary | ICD-10-CM | POA: Diagnosis not present

## 2017-04-26 DIAGNOSIS — W540XXA Bitten by dog, initial encounter: Secondary | ICD-10-CM | POA: Diagnosis not present

## 2017-04-26 DIAGNOSIS — Y998 Other external cause status: Secondary | ICD-10-CM | POA: Insufficient documentation

## 2017-04-26 DIAGNOSIS — S41151A Open bite of right upper arm, initial encounter: Secondary | ICD-10-CM

## 2017-04-26 MED ORDER — OXYCODONE-ACETAMINOPHEN 5-325 MG PO TABS
1.0000 | ORAL_TABLET | ORAL | Status: DC | PRN
  Administered 2017-04-26: 1 via ORAL
  Filled 2017-04-26: qty 1

## 2017-04-26 MED ORDER — FLUCONAZOLE 200 MG PO TABS: 200.0000 mg | ORAL_TABLET | Freq: Every day | ORAL | 0 refills | Status: AC

## 2017-04-26 MED ORDER — TRAMADOL HCL 50 MG PO TABS: 50.0000 mg | ORAL_TABLET | Freq: Four times a day (QID) | ORAL | 0 refills | Status: DC | PRN

## 2017-04-26 MED ORDER — AMOXICILLIN-POT CLAVULANATE 875-125 MG PO TABS: 1.0000 | ORAL_TABLET | Freq: Two times a day (BID) | ORAL | 0 refills | Status: DC

## 2017-04-26 MED ORDER — LIDOCAINE-EPINEPHRINE 1 %-1:100000 IJ SOLN
10.0000 mL | Freq: Once | INTRAMUSCULAR | Status: AC
  Administered 2017-04-26: 10 mL
  Filled 2017-04-26: qty 10

## 2017-04-26 NOTE — ED Triage Notes (Signed)
Pt states she was bit by her mother's german shepard this morning  Pt has a dressing on her right forearm placed by EMS  States she has a wound about 1"  Pt is c/o pain and tightness around the wound  Pt states the dog was supposed to go to the vet yesterday to get updated on his shots but they were not able to take him

## 2017-04-26 NOTE — Discharge Instructions (Signed)
Change bandage at least once daily. Keep clean/dry otherwise. Clean wound with mild soap/water. Do not scrub. Do not use rubbing alcohol/peroxide.

## 2017-04-28 NOTE — ED Provider Notes (Signed)
[facility]-EMERGENCY DEPT Provider Note   CSN: [id number] Arrival date & time: 04/26/17  0705     History   Chief Complaint Chief Complaint  Patient presents with  . Animal Bite    HPI Barbara Yang is a 37 y.o. female.  HPI   37 year old female with dog bite to right forearm.  Happened this morning.  Family dog.  Animal is being quarantined.  Apparently the animal is startled and bit the patient.  She denies any other significant wounds.  She reports that tetanus is current.  Past Medical History:  Diagnosis Date  . Cough    non productive  . Heartburn during pregnancy    takes Tums daily  . Medical history non-contributory     Patient Active Problem List   Diagnosis Date Noted  . Active labor 09/26/2014  . NVD (normal vaginal delivery) 09/26/2014  . Evaluate anatomy not seen on prior sonogram   . Obesity affecting pregnancy, antepartum   . Multiple marker screen positive for Down syndrome   . AMA (advanced maternal age) multigravida 35+   . [redacted] weeks gestation of pregnancy   . Encounter for fetal anatomic survey   . Abnormal quad screen   . Advanced maternal age in multigravida   . Pregnancy 04/23/2014  . Lisfranc dislocation 04/22/2014    Past Surgical History:  Procedure Laterality Date  . FOOT SURGERY    . HARDWARE REMOVAL Left 09/09/2014   Procedure: HARDWARE REMOVAL LEFT FOOT ;  Surgeon: Myrene Galas, MD;  Location: [facility] OR;  Service: Orthopedics;  Laterality: Left;  . HERNIA REPAIR    . ORIF ANKLE FRACTURE Left 04/22/2014   Procedure: OPEN REDUCTION INTERNAL FIXATION (ORIF) LEFT LISFRANC FRACTURE;  Surgeon: Budd Palmer, MD;  Location: [facility] OR;  Service: Orthopedics;  Laterality: Left;  . UMBILICAL HERNIA REPAIR      OB History    Gravida Para Term Preterm AB Living   7 2 2  0 4 1   SAB TAB Ectopic Multiple Live Births   4 0 0 0 1       Home Medications    Prior to Admission medications   Medication Sig Start Date End  Date Taking? Authorizing Provider  diphenhydrAMINE (SOMINEX) 25 MG tablet Take 25 mg by mouth at bedtime as needed for sleep.   Yes [provider]  Diphenhydramine-APAP, sleep, (GOODY PM PO) Take 1 packet by mouth daily as needed (HEADACHE).   Yes [provider]  Prenatal Vit-Fe Fumarate-FA (PRENATAL PO) Take 1 tablet by mouth daily.   Yes [provider]  amoxicillin (AMOXIL) 500 MG capsule Take 1 capsule (500 mg total) by mouth 3 (three) times daily. Patient not taking: Reported on 04/26/2017 03/24/16   Riki Sheer, PA-C  amoxicillin-clavulanate (AUGMENTIN) 875-125 MG tablet Take 1 tablet by mouth 2 (two) times daily. 04/26/17   Raeford Razor, MD  fluconazole (DIFLUCAN) 150 MG tablet Take 1 tablet (150 mg total) by mouth daily. Patient not taking: Reported on 04/26/2017 09/26/16   Deatra Canter, FNP  fluconazole (DIFLUCAN) 200 MG tablet Take 1 tablet (200 mg total) by mouth daily for 2 doses. One dose and then second dose when you finish antibiotics 04/26/17 04/28/17  Raeford Razor, MD  miconazole (MICOTIN) 200 MG vaginal suppository Place 1 suppository (200 mg total) vaginally at bedtime. Patient not taking: Reported on 04/26/2017 09/26/16   Deatra Canter, FNP  predniSONE (STERAPRED UNI-PAK 21 TAB) 5 MG (21) TBPK tablet Take  1 tablet (5 mg total) by mouth daily. Patient not taking: Reported on 04/26/2017 03/24/16   Riki Sheer, PA-C  terbinafine (LAMISIL) 1 % cream Apply 1 application topically 2 (two) times daily. Patient not taking: Reported on 03/24/2016 01/03/16   Sheard, Myeong O, DPM  traMADol (ULTRAM) 50 MG tablet Take 1 tablet (50 mg total) by mouth every 6 (six) hours as needed. 04/26/17   Raeford Razor, MD    Family History Family History  Problem Relation Age of Onset  . Cancer Mother     Social History Social History   Tobacco Use  . Smoking status: Never Smoker  . Smokeless tobacco: Never Used  Substance Use Topics  . Alcohol use: No  .  Drug use: No     Allergies   Patient has no known allergies.   Review of Systems Review of Systems   Physical Exam Updated Vital Signs BP 127/65 (BP Location: Left Arm)   Pulse 77   Temp 98.4 F (36.9 C) (Oral)   Resp 18   LMP 04/08/2017 (Exact Date)   SpO2 100%   Physical Exam  Constitutional: She appears well-developed and well-nourished. No distress.  HENT:  Head: Normocephalic and atraumatic.  Eyes: Conjunctivae are normal. Right eye exhibits no discharge. Left eye exhibits no discharge.  Neck: Neck supple.  Cardiovascular: Normal rate, regular rhythm and normal heart sounds. Exam reveals no gallop and no friction rub.  No murmur heard. Pulmonary/Chest: Effort normal and breath sounds normal. No respiratory distress.  Abdominal: Soft. She exhibits no distension. There is no tenderness.  Musculoskeletal: She exhibits no edema or tenderness.  Approximately 3 cm wound to the dorsal aspect of the mid right forearm.  Down to the subcutaneous fat.  Wound was evaluated after anesthetized in a bloodless field.  No significant injuries to deep structures.  It was thoroughly irrigated.   Neurological: She is alert.  Skin: Skin is warm and dry.  Psychiatric: She has a normal mood and affect. Her behavior is normal. Thought content normal.  Nursing note and vitals reviewed.    ED Treatments / Results  Labs (all labs ordered are listed, but only abnormal results are displayed) Labs Reviewed - No data to display  EKG  EKG Interpretation None       Radiology No results found.  Procedures Procedures (including critical care time)  Medications Ordered in ED Medications  lidocaine-EPINEPHrine (XYLOCAINE W/EPI) 1 %-1:100000 (with pres) injection 10 mL (10 mLs Infiltration Given 04/26/17 1235)     Initial Impression / Assessment and Plan / ED Course  I have reviewed the triage vital signs and the nursing notes.  Pertinent labs & imaging results that were available  during my care of the patient were reviewed by me and considered in my medical decision making (see chart for details).     Dog bite to forearm. NVI.  It was left open to heal by secondary intention.  Continued wound care return precautions were discussed.  Course of antibiotics.  Final Clinical Impressions(s) / ED Diagnoses   Final diagnoses:  Dog bite of right upper extremity, initial encounter    ED Discharge Orders        Ordered    traMADol (ULTRAM) 50 MG tablet  Every 6 hours PRN     04/26/17 1210    amoxicillin-clavulanate (AUGMENTIN) 875-125 MG tablet  2 times daily     04/26/17 1210    fluconazole (DIFLUCAN) 200 MG tablet  Daily  04/26/17 1252       Raeford RazorKohut, Genola Yuille, MD 04/28/17 [phone]

## 2017-05-03 ENCOUNTER — Encounter (HOSPITAL_COMMUNITY): Payer: Self-pay | Admitting: Emergency Medicine

## 2017-05-03 ENCOUNTER — Ambulatory Visit (HOSPITAL_COMMUNITY)
Admission: EM | Admit: 2017-05-03 | Discharge: 2017-05-03 | Disposition: A | Discharge status: Home / Self Care | Payer: 59 | Attending: Family Medicine | Admitting: Family Medicine

## 2017-05-03 DIAGNOSIS — R102 Pelvic and perineal pain: Secondary | ICD-10-CM | POA: Diagnosis not present

## 2017-05-03 DIAGNOSIS — N760 Acute vaginitis: Secondary | ICD-10-CM | POA: Insufficient documentation

## 2017-05-03 DIAGNOSIS — Z8619 Personal history of other infectious and parasitic diseases: Secondary | ICD-10-CM | POA: Insufficient documentation

## 2017-05-03 DIAGNOSIS — N898 Other specified noninflammatory disorders of vagina: Secondary | ICD-10-CM

## 2017-05-03 MED ORDER — MICONAZOLE NITRATE 200 MG VA SUPP: 200.0000 mg | Freq: Every day | VAGINAL | 0 refills | Status: DC

## 2017-05-03 NOTE — ED Triage Notes (Signed)
PT C/O: reports poss yeast infection onset 5 days... sts she finished amoxicillin on Wednesday and now has some vag d/c and itching   sts she took diflucan x2 but it doesn't work and wants the cream.   A&O x4... NAD... Ambulatory

## 2017-05-03 NOTE — Discharge Instructions (Signed)
You were treated empirically for yeast. Start miconazole as directed. Cytology sent, you will be contacted with any positive results that requires further treatment. Refrain from sexual activity for the next 7 days. Monitor for any worsening of symptoms, fever, abdominal pain, nausea, vomiting, to follow up for reevaluation.

## 2017-05-03 NOTE — ED Provider Notes (Signed)
[facility]    CSN: [id number] Arrival date & time: 05/03/17  1736     History   Chief Complaint Chief Complaint  Patient presents with  . Vaginitis    HPI Doreene AdasJennifer Munro is a 37 y.o. female.   37 year old female comes in for 5-day history of vaginal irritation with discharge.  States possible yeast infection as she finished amoxicillin recently.  States she was given Diflucan, but has never worked for her in the past.  She states she has had miconazole suppository to treat for vulvovaginal candidiasis with good relief.  Denies fever, chills, night sweats. Has some vaginal pain, otherwise denies abdominal pain, nausea, vomiting. States irritation with urinating, but denies frequency, hematuria, urgency.       Past Medical History:  Diagnosis Date  . Cough    non productive  . Heartburn during pregnancy    takes Tums daily  . Medical history non-contributory     Patient Active Problem List   Diagnosis Date Noted  . Active labor 09/26/2014  . NVD (normal vaginal delivery) 09/26/2014  . Evaluate anatomy not seen on prior sonogram   . Obesity affecting pregnancy, antepartum   . Multiple marker screen positive for Down syndrome   . AMA (advanced maternal age) multigravida 35+   . [redacted] weeks gestation of pregnancy   . Encounter for fetal anatomic survey   . Abnormal quad screen   . Advanced maternal age in multigravida   . Pregnancy 04/23/2014  . Lisfranc dislocation 04/22/2014    Past Surgical History:  Procedure Laterality Date  . FOOT SURGERY    . HARDWARE REMOVAL Left 09/09/2014   Procedure: HARDWARE REMOVAL LEFT FOOT ;  Surgeon: Myrene GalasMichael Handy, MD;  [facility];  Service: Orthopedics;  Laterality: Left;  . HERNIA REPAIR    . ORIF ANKLE FRACTURE Left 04/22/2014   Procedure: OPEN REDUCTION INTERNAL FIXATION (ORIF) LEFT LISFRANC FRACTURE;  Surgeon: Budd PalmerMichael H Handy, MD;  Location: [facility] OR;  Service: Orthopedics;  Laterality: Left;  . UMBILICAL HERNIA  REPAIR      OB History    Gravida Para Term Preterm AB Living   7 2 2  0 4 1   SAB TAB Ectopic Multiple Live Births   4 0 0 0 1       Home Medications    Prior to Admission medications   Medication Sig Start Date End Date Taking? Authorizing Provider  miconazole (MICOTIN) 200 MG vaginal suppository Place 1 suppository (200 mg total) vaginally at bedtime. 05/03/17   Belinda FisherYu, Baker Moronta V, PA-C    Family History Family History  Problem Relation Age of Onset  . Cancer Mother     Social History Social History   Tobacco Use  . Smoking status: Never Smoker  . Smokeless tobacco: Never Used  Substance Use Topics  . Alcohol use: No  . Drug use: No     Allergies   Patient has no known allergies.   Review of Systems Review of Systems  Reason unable to perform ROS: See HPI as above.     Physical Exam Triage Vital Signs ED Triage Vitals [05/03/17 1802]  Enc Vitals Group     BP 102/62     Pulse Rate 60     Resp 18     Temp 98.9 F (37.2 C)     Temp Source Oral     SpO2 99 %     Weight      Height  Head Circumference      Peak Flow      Pain Score      Pain Loc      Pain Edu?      Excl. in GC?    No data found.  Updated Vital Signs BP 102/62 (BP Location: Left Arm)   Pulse 60   Temp 98.9 F (37.2 C) (Oral)   Resp 18   LMP 04/08/2017 (Exact Date)   SpO2 99%   Physical Exam  Constitutional: She is oriented to person, place, and time. She appears well-developed and well-nourished. No distress.  HENT:  Head: Normocephalic and atraumatic.  Eyes: Conjunctivae are normal. Pupils are equal, round, and reactive to light.  Cardiovascular: Normal rate, regular rhythm and normal heart sounds. Exam reveals no gallop and no friction rub.  No murmur heard. Pulmonary/Chest: Effort normal and breath sounds normal. She has no wheezes. She has no rales.  Abdominal: Soft. Bowel sounds are normal. She exhibits no mass. There is no tenderness. There is no rebound, no guarding  and no CVA tenderness.  Neurological: She is alert and oriented to person, place, and time.  Skin: Skin is warm and dry.  Psychiatric: She has a normal mood and affect. Her behavior is normal. Judgment normal.     UC Treatments / Results  Labs (all labs ordered are listed, but only abnormal results are displayed) Labs Reviewed  URINE CYTOLOGY ANCILLARY ONLY    EKG  EKG Interpretation None       Radiology No results found.  Procedures Procedures (including critical care time)  Medications Ordered in UC Medications - No data to display   Initial Impression / Assessment and Plan / UC Course  I have reviewed the triage vital signs and the nursing notes.  Pertinent labs & imaging results that were available during my care of the patient were reviewed by me and considered in my medical decision making (see chart for details).    Patient was treated empirically for yeast. Start miconazole as directed. Cytology sent, patient will be contacted with any positive results that require additional treatment. Patient to refrain from sexual activity for the next 7 days. Return precautions given.    Final Clinical Impressions(s) / UC Diagnoses   Final diagnoses:  Vaginal irritation    ED Discharge Orders        Ordered    miconazole (MICOTIN) 200 MG vaginal suppository  Daily at bedtime     05/03/17 1830        Belinda Fisher, PA-C 05/03/17 1834

## 2017-05-06 ENCOUNTER — Telehealth (HOSPITAL_COMMUNITY): Payer: Self-pay | Admitting: Emergency Medicine

## 2017-05-06 LAB — URINE CYTOLOGY ANCILLARY ONLY
Chlamydia: NEGATIVE
Neisseria Gonorrhea: NEGATIVE
Trichomonas: NEGATIVE

## 2017-05-06 NOTE — Telephone Encounter (Signed)
Patient has called 2 times today. Verified patient identity with 2 verifiers.   2:24pm- labs not back.    17:30 - reports trick, gc, chlamydia as negative.  Patient aware.

## 2017-05-08 LAB — URINE CYTOLOGY ANCILLARY ONLY
Bacterial vaginitis: NEGATIVE
CANDIDA VAGINITIS: NEGATIVE

## 2017-08-17 ENCOUNTER — Telehealth (HOSPITAL_COMMUNITY): Payer: Self-pay | Admitting: Emergency Medicine

## 2017-08-17 ENCOUNTER — Ambulatory Visit (HOSPITAL_COMMUNITY)
Admission: EM | Admit: 2017-08-17 | Discharge: 2017-08-17 | Disposition: A | Discharge status: Home / Self Care | Payer: 59 | Attending: Internal Medicine | Admitting: Internal Medicine

## 2017-08-17 ENCOUNTER — Other Ambulatory Visit: Payer: Self-pay

## 2017-08-17 ENCOUNTER — Encounter (HOSPITAL_COMMUNITY): Payer: Self-pay | Admitting: Emergency Medicine

## 2017-08-17 DIAGNOSIS — A084 Viral intestinal infection, unspecified: Secondary | ICD-10-CM

## 2017-08-17 MED ORDER — ONDANSETRON 4 MG PO TBDP: 4.0000 mg | ORAL_TABLET | Freq: Three times a day (TID) | ORAL | 0 refills | Status: DC | PRN

## 2017-08-17 MED ORDER — ONDANSETRON 4 MG PO TBDP
ORAL_TABLET | ORAL | Status: AC
  Filled 2017-08-17: qty 2

## 2017-08-17 MED ORDER — ONDANSETRON 4 MG PO TBDP
8.0000 mg | ORAL_TABLET | Freq: Once | ORAL | Status: AC
Start: 2017-08-17 — End: 2017-08-17
  Administered 2017-08-17: 8 mg via ORAL

## 2017-08-17 NOTE — ED Provider Notes (Signed)
[facility]    CSN: [id number] Arrival date & time: 08/17/17  1227     History   Chief Complaint Chief Complaint  Patient presents with  . Nausea    HPI Barbara Yang is a 37 y.o. female.   37 y.o. female presents with nausea, vomiting and diarrhea  X 3 days. Patient states that she ate out on Wednesday night and the symptoms began on Thursday. Patient states that the diarrhea has resolved but the nausea and vomit ting is persistent. Condition is made better by nothing. Condition is made worse by drinking . Patient denies any treatment prior to there arrival at this facility. Patient denies any fevers, uri symptoms or UTI symptoms. Patient states that she has a child who was sick with fever but now resolved. Patient states that she is urinating.       Past Medical History:  Diagnosis Date  . Cough    non productive  . Heartburn during pregnancy    takes Tums daily  . Medical history non-contributory     Patient Active Problem List   Diagnosis Date Noted  . Active labor 09/26/2014  . NVD (normal vaginal delivery) 09/26/2014  . Evaluate anatomy not seen on prior sonogram   . Obesity affecting pregnancy, antepartum   . Multiple marker screen positive for Down syndrome   . AMA (advanced maternal age) multigravida 35+   . [redacted] weeks gestation of pregnancy   . Encounter for fetal anatomic survey   . Abnormal quad screen   . Advanced maternal age in multigravida   . Pregnancy 04/23/2014  . Lisfranc dislocation 04/22/2014    Past Surgical History:  Procedure Laterality Date  . FOOT SURGERY    . HARDWARE REMOVAL Left 09/09/2014   Procedure: HARDWARE REMOVAL LEFT FOOT ;  Surgeon: Myrene Galas, MD;  Location: [facility] OR;  Service: Orthopedics;  Laterality: Left;  . HERNIA REPAIR    . ORIF ANKLE FRACTURE Left 04/22/2014   Procedure: OPEN REDUCTION INTERNAL FIXATION (ORIF) LEFT LISFRANC FRACTURE;  Surgeon: Budd Palmer, MD;  Location: [facility] OR;  Service: Orthopedics;   Laterality: Left;  . UMBILICAL HERNIA REPAIR      OB History    Gravida  7   Para  2   Term  2   Preterm  0   AB  4   Living  1     SAB  4   TAB  0   Ectopic  0   Multiple  0   Live Births  1            Home Medications    Prior to Admission medications   Medication Sig Start Date End Date Taking? Authorizing Provider  ondansetron (ZOFRAN ODT) 4 MG disintegrating tablet Take 1 tablet (4 mg total) by mouth every 8 (eight) hours as needed for nausea or vomiting. 08/17/17   Alene Mires, NP    Family History Family History  Problem Relation Age of Onset  . Cancer Mother     Social History Social History   Tobacco Use  . Smoking status: Never Smoker  . Smokeless tobacco: Never Used  Substance Use Topics  . Alcohol use: No  . Drug use: No     Allergies   Patient has no known allergies.   Review of Systems Review of Systems  Constitutional: Negative for chills and fever.  HENT: Negative for ear pain and sore throat.   Eyes: Negative for pain and visual disturbance.  Respiratory: Negative for cough and shortness of breath.   Cardiovascular: Negative for chest pain and palpitations.  Gastrointestinal: Positive for diarrhea, nausea and vomiting. Negative for abdominal pain.  Genitourinary: Negative for dysuria and hematuria.  Musculoskeletal: Negative for arthralgias and back pain.  Skin: Negative for color change and rash.  Neurological: Negative for seizures and syncope.  All other systems reviewed and are negative.    Physical Exam Triage Vital Signs ED Triage Vitals [08/17/17 1325]  Enc Vitals Group     BP 105/70     Pulse Rate 67     Resp 18     Temp 99.2 F (37.3 C)     Temp Source Oral     SpO2 98 %     Weight      Height      Head Circumference      Peak Flow      Pain Score      Pain Loc      Pain Edu?      Excl. in GC?    No data found.  Updated Vital Signs BP 105/70 (BP Location: Right Arm)   Pulse 67    Temp 99.2 F (37.3 C) (Oral)   Resp 18   SpO2 98%   Visual Acuity Right Eye Distance:   Left Eye Distance:   Bilateral Distance:    Right Eye Near:   Left Eye Near:    Bilateral Near:     Physical Exam  Constitutional: She is oriented to person, place, and time. She appears well-developed and well-nourished.  HENT:  Head: Normocephalic and atraumatic.  Eyes: Conjunctivae are normal.  Neck: Normal range of motion.  Cardiovascular: Normal rate and regular rhythm.  Pulmonary/Chest: Effort normal and breath sounds normal.  Abdominal: She exhibits no distension. There is no tenderness. There is no guarding.  hyperactive bowel sounds  Musculoskeletal: Normal range of motion.  Neurological: She is alert and oriented to person, place, and time.  Skin: Skin is warm.  Psychiatric: She has a normal mood and affect.  Nursing note and vitals reviewed.    UC Treatments / Results  Labs (all labs ordered are listed, but only abnormal results are displayed) Labs Reviewed - No data to display  EKG None  Radiology No results found.  Procedures Procedures (including critical care time)  Medications Ordered in UC Medications  ondansetron (ZOFRAN-ODT) disintegrating tablet 8 mg (8 mg Oral Given 08/17/17 1329)    Initial Impression / Assessment and Plan / UC Course  I have reviewed the triage vital signs and the nursing notes.  Pertinent labs & imaging results that were available during my care of the patient were reviewed by me and considered in my medical decision making (see chart for details).      Final Clinical Impressions(s) / UC Diagnoses   Final diagnoses:  Viral gastroenteritis   Discharge Instructions   None    ED Prescriptions    Medication Sig Dispense Auth. Provider   ondansetron (ZOFRAN ODT) 4 MG disintegrating tablet Take 1 tablet (4 mg total) by mouth every 8 (eight) hours as needed for nausea or vomiting. 20 tablet Alene Miresmohundro, Kaleena C, NP      Controlled Substance Prescriptions  Controlled Substance Registry consulted? Not Applicable   Alene MiresOmohundro, Lillianne C, NP 08/17/17 1336

## 2017-08-17 NOTE — ED Triage Notes (Signed)
[facility] a [facility] of N/V/D x 4 days after [place] [employer].

## 2017-08-17 NOTE — Discharge Instructions (Addendum)
Continue to push fluids and take over the counter medications as directed on the back of the box for symptomatic relief.  ° °

## 2017-09-11 ENCOUNTER — Other Ambulatory Visit: Payer: Self-pay

## 2017-09-11 ENCOUNTER — Ambulatory Visit (HOSPITAL_COMMUNITY)
Admission: EM | Admit: 2017-09-11 | Discharge: 2017-09-11 | Disposition: A | Discharge status: Home / Self Care | Payer: 59 | Attending: Family Medicine | Admitting: Family Medicine

## 2017-09-11 ENCOUNTER — Encounter (HOSPITAL_COMMUNITY): Payer: Self-pay | Admitting: Emergency Medicine

## 2017-09-11 DIAGNOSIS — M62838 Other muscle spasm: Secondary | ICD-10-CM

## 2017-09-11 DIAGNOSIS — S161XXA Strain of muscle, fascia and tendon at neck level, initial encounter: Secondary | ICD-10-CM

## 2017-09-11 MED ORDER — DICLOFENAC SODIUM 75 MG PO TBEC: 75.0000 mg | DELAYED_RELEASE_TABLET | Freq: Two times a day (BID) | ORAL | 0 refills | Status: DC

## 2017-09-11 MED ORDER — CYCLOBENZAPRINE HCL 10 MG PO TABS: 10.0000 mg | ORAL_TABLET | Freq: Three times a day (TID) | ORAL | 0 refills | Status: DC

## 2017-09-11 NOTE — Discharge Instructions (Signed)
Be aware, muscle relaxer medications may cause drowsiness. Please do not drive, operate heavy machinery or make important decisions while on this medication, it can cloud your judgement. ° °

## 2017-09-11 NOTE — ED Triage Notes (Signed)
Right neck and right shoulder pain for a week.  Patient stopped carrying purse on this shoulder.  Patient is right handed.

## 2017-09-11 NOTE — ED Provider Notes (Signed)
[facility]   [id number] 09/11/17 Arrival Time: 1816  ASSESSMENT & PLAN:  1. Strain of neck muscle, initial encounter   2. Muscle spasms of neck    Meds ordered this encounter  Medications  . diclofenac (VOLTAREN) 75 MG EC tablet    Sig: Take 1 tablet (75 mg total) by mouth 2 (two) times daily.    Dispense:  14 tablet    Refill:  0  . cyclobenzaprine (FLEXERIL) 10 MG tablet    Sig: Take 1 tablet (10 mg total) by mouth 3 (three) times daily.    Dispense:  20 tablet    Refill:  0   Natural history and expected course discussed. Questions answered. Encouraged ROM as she tolerates.  Follow-up Information    [facility]   Specialty:  Urgent Care Why:  If symptoms worsen. Contact information: [address] [place] [id number] [phone]         Medication sedation precautions. Reviewed expectations re: course of current medical issues. Questions answered. Outlined signs and symptoms indicating need for more acute intervention. Patient verbalized understanding. After Visit Summary given.  SUBJECTIVE: History from: patient. Barbara AdasJennifer Yang is a 37 y.o. female who reports fairly persistent bilateral mild to moderate pain of her right neck and shoulder that is stable; described as aching without radiation. Onset: gradual, a week ago. Injury/trama: no, but carries heavy purse on R shoulder. Relieved by: nothing specific reported. Worsened by: certain movements. Associated symptoms: none reported. Extremity sensation changes or weakness: none. Self treatment: has not tried OTCs for relief of pain. History of similar: no  ROS: As per HPI.   OBJECTIVE:  Vitals:   09/11/17 1828  BP: 111/69  Pulse: 91  Resp: 18  Temp: 99.4 F (37.4 C)  TempSrc: Oral  SpO2: 98%    General appearance: alert; no distress Extremities: warm and well perfused; symmetrical with no gross deformities; localized tenderness  over her right lateral neck and shoulder (over trapezius distribution) with no swelling and no bruising; neck ROM: limited by pain CV: normal extremity capillary refill Skin: warm and dry Neurologic: normal gait; normal symmetric reflexes in all extremities; normal sensation in all extremities Psychological: alert and cooperative; normal mood and affect  No Known Allergies  Past Medical History:  Diagnosis Date  . Cough    non productive  . Heartburn during pregnancy    takes Tums daily  . Medical history non-contributory    Social History   Socioeconomic History  . Marital status: Married    Spouse name: Not on file  . Number of children: Not on file  . Years of education: Not on file  . Highest education level: Not on file  Occupational History  . Not on file  Social Needs  . Financial resource strain: Not on file  . Food insecurity:    Worry: Not on file    Inability: Not on file  . Transportation needs:    Medical: Not on file    Non-medical: Not on file  Tobacco Use  . Smoking status: Never Smoker  . Smokeless tobacco: Never Used  Substance and Sexual Activity  . Alcohol use: No  . Drug use: No  . Sexual activity: Yes    Birth control/protection: None    Comment: last intercourse 2 weeks ago  Lifestyle  . Physical activity:    Days per week: Not on file    Minutes per session: Not on file  .  Stress: Not on file  Relationships  . Social connections:    Talks on phone: Not on file    Gets together: Not on file    Attends religious service: Not on file    Active member of club or organization: Not on file    Attends meetings of clubs or organizations: Not on file    Relationship status: Not on file  . Intimate partner violence:    Fear of current or ex partner: Not on file    Emotionally abused: Not on file    Physically abused: Not on file    Forced sexual activity: Not on file  Other Topics Concern  . Not on file  Social History Narrative  . Not on  file   Family History  Problem Relation Age of Onset  . Cancer Mother    Past Surgical History:  Procedure Laterality Date  . FOOT SURGERY    . HARDWARE REMOVAL Left 09/09/2014   Procedure: HARDWARE REMOVAL LEFT FOOT ;  Surgeon: Myrene Galas, MD;  Location: [facility] OR;  Service: Orthopedics;  Laterality: Left;  . HERNIA REPAIR    . ORIF ANKLE FRACTURE Left 04/22/2014   Procedure: OPEN REDUCTION INTERNAL FIXATION (ORIF) LEFT LISFRANC FRACTURE;  Surgeon: Budd Palmer, MD;  Location: [facility] OR;  Service: Orthopedics;  Laterality: Left;  . UMBILICAL HERNIA REPAIR        Mardella Layman, MD 09/12/17 1020

## 2018-04-03 ENCOUNTER — Encounter (HOSPITAL_COMMUNITY): Payer: Self-pay

## 2018-04-03 ENCOUNTER — Ambulatory Visit (HOSPITAL_COMMUNITY)
Admission: EM | Admit: 2018-04-03 | Discharge: 2018-04-03 | Disposition: A | Discharge status: Home / Self Care | Payer: No Typology Code available for payment source | Attending: Family Medicine | Admitting: Family Medicine

## 2018-04-03 DIAGNOSIS — N760 Acute vaginitis: Secondary | ICD-10-CM | POA: Diagnosis not present

## 2018-04-03 NOTE — Discharge Instructions (Addendum)
We are testing the swab for bacteria, yeast and STDs We will call with any positive results.

## 2018-04-03 NOTE — ED Triage Notes (Signed)
Pt presents with complaints of vaginal irritation x 3 days after shaving.

## 2018-04-04 LAB — CERVICOVAGINAL ANCILLARY ONLY
BACTERIAL VAGINITIS: NEGATIVE
CANDIDA VAGINITIS: NEGATIVE
CHLAMYDIA, DNA PROBE: NEGATIVE
NEISSERIA GONORRHEA: NEGATIVE
TRICH (WINDOWPATH): NEGATIVE

## 2018-04-04 NOTE — ED Provider Notes (Signed)
[facility]    CSN: [id number] Arrival date & time: 04/03/18  1517     History   Chief Complaint Chief Complaint  Patient presents with  . Vaginitis    HPI Barbara Yang is a 38 y.o. female.   Patient is a 38 year old female that presents with vaginal irritation, itching, mild discharge.  Reports that her symptoms started after shaving a couple days ago.  She is currently sexually active with one partner, unprotected.  She is having menstrual cycles.  Her last menstrual cycle was 03/20/2018.  She denies any associated fevers, chills, body aches, abdominal pain, back pain, nausea.  She has had some mild dysuria but denies any hematuria or urinary frequency.  She is requesting to be tested for STDs today.  ROS per HPI      Past Medical History:  Diagnosis Date  . Cough    non productive  . Heartburn during pregnancy    takes Tums daily  . Medical history non-contributory     Patient Active Problem List   Diagnosis Date Noted  . Active labor 09/26/2014  . NVD (normal vaginal delivery) 09/26/2014  . Evaluate anatomy not seen on prior sonogram   . Obesity affecting pregnancy, antepartum   . Multiple marker screen positive for Down syndrome   . AMA (advanced maternal age) multigravida 35+   . [redacted] weeks gestation of pregnancy   . Encounter for fetal anatomic survey   . Abnormal quad screen   . Advanced maternal age in multigravida   . Pregnancy 04/23/2014  . Lisfranc dislocation 04/22/2014    Past Surgical History:  Procedure Laterality Date  . FOOT SURGERY    . HARDWARE REMOVAL Left 09/09/2014   Procedure: HARDWARE REMOVAL LEFT FOOT ;  Surgeon: Myrene GalasMichael Handy, MD;  [facility];  Service: Orthopedics;  Laterality: Left;  . HERNIA REPAIR    . ORIF ANKLE FRACTURE Left 04/22/2014   Procedure: OPEN REDUCTION INTERNAL FIXATION (ORIF) LEFT LISFRANC FRACTURE;  Surgeon: Budd PalmerMichael H Handy, MD;  Location: [facility] OR;  Service: Orthopedics;  Laterality: Left;  . UMBILICAL  HERNIA REPAIR      OB History    Gravida  7   Para  2   Term  2   Preterm  0   AB  4   Living  1     SAB  4   TAB  0   Ectopic  0   Multiple  0   Live Births  1            Home Medications    Prior to Admission medications   Medication Sig Start Date End Date Taking? Authorizing Provider  cyclobenzaprine (FLEXERIL) 10 MG tablet Take 1 tablet (10 mg total) by mouth 3 (three) times daily. 09/11/17   Mardella LaymanHagler, Brian, MD  diclofenac (VOLTAREN) 75 MG EC tablet Take 1 tablet (75 mg total) by mouth 2 (two) times daily. 09/11/17   Mardella LaymanHagler, Brian, MD    Family History Family History  Problem Relation Age of Onset  . Cancer Mother     Social History Social History   Tobacco Use  . Smoking status: Never Smoker  . Smokeless tobacco: Never Used  Substance Use Topics  . Alcohol use: No  . Drug use: No     Allergies   Patient has no known allergies.   Review of Systems Review of Systems   Physical Exam Triage Vital Signs ED Triage Vitals  Enc Vitals Group  BP 04/03/18 1554 107/67     Pulse Rate 04/03/18 1554 80     Resp 04/03/18 1554 19     Temp 04/03/18 1554 99 F (37.2 C)     Temp src --      SpO2 04/03/18 1554 99 %     Weight --      Height --      Head Circumference --      Peak Flow --      Pain Score 04/03/18 1552 0     Pain Loc --      Pain Edu? --      Excl. in [place]? --    No data found.  Updated Vital Signs BP 107/67   Pulse 80   Temp 99 F (37.2 C)   Resp 19   LMP 03/20/2018   SpO2 99%   Visual Acuity Right Eye Distance:   Left Eye Distance:   Bilateral Distance:    Right Eye Near:   Left Eye Near:    Bilateral Near:     Physical Exam Vitals signs and nursing note reviewed.  Constitutional:      General: She is not in acute distress.    Appearance: She is well-developed.  HENT:     Head: Normocephalic and atraumatic.  Eyes:     Conjunctiva/sclera: Conjunctivae normal.  Neck:     Musculoskeletal: Neck supple.    Cardiovascular:     Rate and Rhythm: Normal rate and regular rhythm.     Heart sounds: No murmur.  Pulmonary:     Effort: Pulmonary effort is normal. No respiratory distress.     Breath sounds: Normal breath sounds.  Abdominal:     Palpations: Abdomen is soft.     Tenderness: There is no abdominal tenderness.  Genitourinary:    Comments: Deferred Skin:    General: Skin is warm and dry.     Findings: No rash.  Neurological:     Mental Status: She is alert.      UC Treatments / Results  Labs (all labs ordered are listed, but only abnormal results are displayed) Labs Reviewed  CERVICOVAGINAL ANCILLARY ONLY    EKG None  Radiology No results found.  Procedures Procedures (including critical care time)  Medications Ordered in UC Medications - No data to display  Initial Impression / Assessment and Plan / UC Course  I have reviewed the triage vital signs and the nursing notes.  Pertinent labs & imaging results that were available during my care of the patient were reviewed by me and considered in my medical decision making (see chart for details).     Self swab sent for testing. Testing for STDs, BV and yeast. Patient does not want to be treated for anything today. She would like to wait on the results. Lab results pending Final Clinical Impressions(s) / UC Diagnoses   Final diagnoses:  Vaginitis and vulvovaginitis     Discharge Instructions     We are testing the swab for bacteria, yeast and STDs We will call with any positive results.     ED Prescriptions    None     Controlled Substance Prescriptions [place] Controlled Substance Registry consulted? no   Janace ArisBast, Xaria Judon A, NP 04/04/18 [phone]

## 2018-05-24 ENCOUNTER — Ambulatory Visit (HOSPITAL_COMMUNITY)
Admission: EM | Admit: 2018-05-24 | Discharge: 2018-05-24 | Disposition: A | Discharge status: Home / Self Care | Payer: No Typology Code available for payment source | Attending: Family Medicine | Admitting: Family Medicine

## 2018-05-24 ENCOUNTER — Encounter (HOSPITAL_COMMUNITY): Payer: Self-pay

## 2018-05-24 DIAGNOSIS — Z3202 Encounter for pregnancy test, result negative: Secondary | ICD-10-CM | POA: Diagnosis not present

## 2018-05-24 DIAGNOSIS — N760 Acute vaginitis: Secondary | ICD-10-CM | POA: Insufficient documentation

## 2018-05-24 DIAGNOSIS — K5900 Constipation, unspecified: Secondary | ICD-10-CM | POA: Insufficient documentation

## 2018-05-24 LAB — POCT URINALYSIS DIP (DEVICE)
Glucose, UA: NEGATIVE mg/dL
Hgb urine dipstick: NEGATIVE
KETONES UR: NEGATIVE mg/dL
Leukocytes,Ua: NEGATIVE
Nitrite: NEGATIVE
Protein, ur: 30 mg/dL — AB
Specific Gravity, Urine: >=1.03 (ref 1.005–1.030)
Urobilinogen, UA: 1 mg/dL (ref 0.0–1.0)
pH: 6.5 (ref 5.0–8.0)

## 2018-05-24 LAB — POCT PREGNANCY, URINE: PREG TEST UR: NEGATIVE

## 2018-05-24 MED ORDER — FLUCONAZOLE 200 MG PO TABS: 200.0000 mg | ORAL_TABLET | Freq: Once | ORAL | 0 refills | Status: AC

## 2018-05-24 MED ORDER — POLYETHYLENE GLYCOL 3350 17 GM/SCOOP PO POWD: 17.0000 g | Freq: Every day | ORAL | 0 refills | Status: DC

## 2018-05-24 NOTE — ED Triage Notes (Signed)
Pt presents with urinary tract and yeast infection symptoms; vaginal irritation, discharge, and lower pelvic pain.

## 2018-05-24 NOTE — Discharge Instructions (Signed)
Take 1 pill for yeast. Will notify you of any positive findings and if any changes to treatment are needed with your vaginal sample. You may monitor your results on your MyChart online as well.   Miralax daily to promote large bowel movement.  Increase fiber and water in your diet to promote regular bowel movement.  If symptoms worsen or do not improve in the next week to return to be seen or to follow up with your PCP.

## 2018-05-24 NOTE — ED Provider Notes (Signed)
[facility]    CSN: [id number] Arrival date & time: 05/24/18  1016     History   Chief Complaint Chief Complaint  Patient presents with  . Urinary Tract Infection    HPI Barbara Yang is a 38 y.o. female.   Tonia presents with complaints of vaginal discharge which is white. Self treated for yeast with 3 day OTC pack last week but still feels she has symptoms. Minimal itching. No odor or pain. LMP 2/27. States she has some pelvic pressure, improves with urination. No frequency or pain with urination. No blood in urine. States after urination the pressure improves. History of constipation but had loose stools 5 days ago. No Bm since then. No nausea, vomiting. No fevers. No current abdominal pain. Sexually active with her husband. No skin rash. No headache. Without contributing medical history.      ROS per HPI, negative if not otherwise mentioned.      Past Medical History:  Diagnosis Date  . Cough    non productive  . Heartburn during pregnancy    takes Tums daily  . Medical history non-contributory     Patient Active Problem List   Diagnosis Date Noted  . Active labor 09/26/2014  . NVD (normal vaginal delivery) 09/26/2014  . Evaluate anatomy not seen on prior sonogram   . Obesity affecting pregnancy, antepartum   . Multiple marker screen positive for Down syndrome   . AMA (advanced maternal age) multigravida 35+   . [redacted] weeks gestation of pregnancy   . Encounter for fetal anatomic survey   . Abnormal quad screen   . Advanced maternal age in multigravida   . Pregnancy 04/23/2014  . Lisfranc dislocation 04/22/2014    Past Surgical History:  Procedure Laterality Date  . FOOT SURGERY    . HARDWARE REMOVAL Left 09/09/2014   Procedure: HARDWARE REMOVAL LEFT FOOT ;  Surgeon: Myrene Galas, MD;  Location: [facility] OR;  Service: Orthopedics;  Laterality: Left;  . HERNIA REPAIR    . ORIF ANKLE FRACTURE Left 04/22/2014   Procedure: OPEN REDUCTION INTERNAL  FIXATION (ORIF) LEFT LISFRANC FRACTURE;  Surgeon: Budd Palmer, MD;  Location: [facility] OR;  Service: Orthopedics;  Laterality: Left;  . UMBILICAL HERNIA REPAIR      OB History    Gravida  7   Para  2   Term  2   Preterm  0   AB  4   Living  1     SAB  4   TAB  0   Ectopic  0   Multiple  0   Live Births  1            Home Medications    Prior to Admission medications   Medication Sig Start Date End Date Taking? Authorizing Provider  cyclobenzaprine (FLEXERIL) 10 MG tablet Take 1 tablet (10 mg total) by mouth 3 (three) times daily. 09/11/17   Mardella Layman, MD  diclofenac (VOLTAREN) 75 MG EC tablet Take 1 tablet (75 mg total) by mouth 2 (two) times daily. 09/11/17   Mardella Layman, MD  fluconazole (DIFLUCAN) 200 MG tablet Take 1 tablet (200 mg total) by mouth once for 1 dose. 05/24/18 05/24/18  Linus Mako B, NP  polyethylene glycol powder (GLYCOLAX/MIRALAX) powder Take 17 g by mouth daily. 05/24/18   Georgetta Haber, NP    Family History Family History  Problem Relation Age of Onset  . Cancer Mother     Social History Social History  Tobacco Use  . Smoking status: Never Smoker  . Smokeless tobacco: Never Used  Substance Use Topics  . Alcohol use: No  . Drug use: No     Allergies   Patient has no known allergies.   Review of Systems Review of Systems   Physical Exam Triage Vital Signs ED Triage Vitals  Enc Vitals Group     BP 05/24/18 1035 110/65     Pulse Rate 05/24/18 1035 85     Resp 05/24/18 1035 20     Temp 05/24/18 1035 98.7 F (37.1 C)     Temp Source 05/24/18 1035 Oral     SpO2 05/24/18 1035 100 %     Weight --      Height --      Head Circumference --      Peak Flow --      Pain Score 05/24/18 1038 6     Pain Loc --      Pain Edu? --      Excl. in GC? --    No data found.  Updated Vital Signs BP 110/65 (BP Location: Left Arm)   Pulse 85   Temp 98.7 F (37.1 C) (Oral)   Resp 20   LMP 05/13/2018   SpO2 100%   Visual  Acuity Right Eye Distance:   Left Eye Distance:   Bilateral Distance:    Right Eye Near:   Left Eye Near:    Bilateral Near:     Physical Exam Constitutional:      General: She is not in acute distress.    Appearance: She is well-developed.  Cardiovascular:     Rate and Rhythm: Normal rate and regular rhythm.     Heart sounds: Normal heart sounds.  Pulmonary:     Effort: Pulmonary effort is normal.     Breath sounds: Normal breath sounds.  Abdominal:     General: Abdomen is flat. There is no distension.     Tenderness: There is no abdominal tenderness.  Genitourinary:    Pubic Area: No rash.      Labia:        Right: No rash, tenderness or lesion.        Left: No rash, tenderness or lesion.      Cervix: Normal.     Comments: Scant thick white vaginal discharge Skin:    General: Skin is warm and dry.  Neurological:     Mental Status: She is alert and oriented to person, place, and time.      UC Treatments / Results  Labs (all labs ordered are listed, but only abnormal results are displayed) Labs Reviewed  POCT URINALYSIS DIP (DEVICE) - Abnormal; Notable for the following components:      Result Value   Bilirubin Urine SMALL (*)    Protein, ur 30 (*)    All other components within normal limits  URINE CULTURE  POC URINE PREG, ED  POCT PREGNANCY, URINE  CERVICOVAGINAL ANCILLARY ONLY    EKG None  Radiology No results found.  Procedures Procedures (including critical care time)  Medications Ordered in UC Medications - No data to display  Initial Impression / Assessment and Plan / UC Course  I have reviewed the triage vital signs and the nursing notes.  Pertinent labs & imaging results that were available during my care of the patient were reviewed by me and considered in my medical decision making (see chart for details).     1 tab diflucan with  vaginal cytology pending. Discussed constipation causing pelvic pressure, use of miralax to promote regular  BM. Will notify of any positive findings from vag cytology and if any changes to treatment are needed.  If symptoms worsen or do not improve in the next week to return to be seen or to follow up with PCP.  Patient verbalized understanding and agreeable to plan.   Final Clinical Impressions(s) / UC Diagnoses   Final diagnoses:  Acute vaginitis  Constipation, unspecified constipation type     Discharge Instructions     Take 1 pill for yeast. Will notify you of any positive findings and if any changes to treatment are needed with your vaginal sample. You may monitor your results on your MyChart online as well.   Miralax daily to promote large bowel movement.  Increase fiber and water in your diet to promote regular bowel movement.  If symptoms worsen or do not improve in the next week to return to be seen or to follow up with your PCP.       ED Prescriptions    Medication Sig Dispense Auth. Provider   polyethylene glycol powder (GLYCOLAX/MIRALAX) powder Take 17 g by mouth daily. 255 g Linus Mako B, NP   fluconazole (DIFLUCAN) 200 MG tablet Take 1 tablet (200 mg total) by mouth once for 1 dose. 1 tablet Georgetta Haber, NP     Controlled Substance Prescriptions [place] Controlled Substance Registry consulted? Not Applicable   Georgetta Haber, NP 05/24/18 1159

## 2018-05-25 LAB — URINE CULTURE

## 2018-05-26 LAB — CERVICOVAGINAL ANCILLARY ONLY
Bacterial vaginitis: NEGATIVE
CANDIDA VAGINITIS: NEGATIVE
CHLAMYDIA, DNA PROBE: NEGATIVE
NEISSERIA GONORRHEA: NEGATIVE
Trichomonas: NEGATIVE

## 2018-10-22 ENCOUNTER — Ambulatory Visit (HOSPITAL_COMMUNITY)
Admission: EM | Admit: 2018-10-22 | Discharge: 2018-10-22 | Disposition: A | Discharge status: Home / Self Care | Payer: No Typology Code available for payment source | Attending: Emergency Medicine | Admitting: Emergency Medicine

## 2018-10-22 ENCOUNTER — Encounter (HOSPITAL_COMMUNITY): Payer: Self-pay | Admitting: Emergency Medicine

## 2018-10-22 ENCOUNTER — Other Ambulatory Visit: Payer: Self-pay

## 2018-10-22 DIAGNOSIS — N898 Other specified noninflammatory disorders of vagina: Secondary | ICD-10-CM | POA: Diagnosis not present

## 2018-10-22 DIAGNOSIS — Z3202 Encounter for pregnancy test, result negative: Secondary | ICD-10-CM | POA: Diagnosis not present

## 2018-10-22 LAB — POCT URINALYSIS DIP (DEVICE)
Bilirubin Urine: NEGATIVE
Glucose, UA: NEGATIVE mg/dL
Ketones, ur: NEGATIVE mg/dL
Leukocytes,Ua: NEGATIVE
Nitrite: NEGATIVE
Protein, ur: NEGATIVE mg/dL
Specific Gravity, Urine: 1.025 (ref 1.005–1.030)
Urobilinogen, UA: 0.2 mg/dL (ref 0.0–1.0)
pH: 7 (ref 5.0–8.0)

## 2018-10-22 LAB — POCT PREGNANCY, URINE: Preg Test, Ur: NEGATIVE

## 2018-10-22 NOTE — ED Provider Notes (Signed)
[facility]    CSN: [id number] Arrival date & time: 10/22/18  [id number]      History   Chief Complaint Chief Complaint  Patient presents with  . Vaginal Discharge    HPI Barbara AdasJennifer Yang is a 38 y.o. female.   Barbara Yang presents with complaints of "egg white" clear stretchy vaginal discharge. She has noticed this for a few days. She was concerned about possible yeast infection. She does not have itching. No white or thick discharge. Has some urinary frequency but no pain. No fevers. She experiences some pelvic pain related to constipation which she manages with miralax. Sexually active with her husband, she is not actively trying for pregnancy. LMP 7 /25.      ROS per HPI, negative if not otherwise mentioned.      Past Medical History:  Diagnosis Date  . Cough    non productive  . Heartburn during pregnancy    takes Tums daily  . Medical history non-contributory     Patient Active Problem List   Diagnosis Date Noted  . Active labor 09/26/2014  . NVD (normal vaginal delivery) 09/26/2014  . Evaluate anatomy not seen on prior sonogram   . Obesity affecting pregnancy, antepartum   . Multiple marker screen positive for Down syndrome   . AMA (advanced maternal age) multigravida 35+   . [redacted] weeks gestation of pregnancy   . Encounter for fetal anatomic survey   . Abnormal quad screen   . Advanced maternal age in multigravida   . Pregnancy 04/23/2014  . Lisfranc dislocation 04/22/2014    Past Surgical History:  Procedure Laterality Date  . FOOT SURGERY    . HARDWARE REMOVAL Left 09/09/2014   Procedure: HARDWARE REMOVAL LEFT FOOT ;  Surgeon: Myrene GalasMichael Handy, MD;  [facility];  Service: Orthopedics;  Laterality: Left;  . HERNIA REPAIR    . ORIF ANKLE FRACTURE Left 04/22/2014   Procedure: OPEN REDUCTION INTERNAL FIXATION (ORIF) LEFT LISFRANC FRACTURE;  Surgeon: Budd PalmerMichael H Handy, MD;  Location: [facility] OR;  Service: Orthopedics;  Laterality: Left;  . UMBILICAL  HERNIA REPAIR      OB History    Gravida  7   Para  2   Term  2   Preterm  0   AB  4   Living  1     SAB  4   TAB  0   Ectopic  0   Multiple  0   Live Births  1            Home Medications    Prior to Admission medications   Not on File    Family History Family History  Problem Relation Age of Onset  . Cancer Mother     Social History Social History   Tobacco Use  . Smoking status: Never Smoker  . Smokeless tobacco: Never Used  Substance Use Topics  . Alcohol use: No  . Drug use: No     Allergies   Patient has no known allergies.   Review of Systems Review of Systems   Physical Exam Triage Vital Signs ED Triage Vitals  Enc Vitals Group     BP 10/22/18 0835 124/82     Pulse Rate 10/22/18 0835 67     Resp 10/22/18 0835 18     Temp 10/22/18 0835 98.8 F (37.1 C)     Temp Source 10/22/18 0835 Oral     SpO2 10/22/18 0835 97 %     Weight --  Height --      Head Circumference --      Peak Flow --      Pain Score 10/22/18 0832 0     Pain Loc --      Pain Edu? --      Excl. in [place]? --    No data found.  Updated Vital Signs BP 124/82 (BP Location: Right Arm)   Pulse 67   Temp 98.8 F (37.1 C) (Oral)   Resp 18   LMP 10/15/2018   SpO2 97%    Physical Exam Constitutional:      General: She is not in acute distress.    Appearance: She is well-developed.  Cardiovascular:     Rate and Rhythm: Normal rate.  Pulmonary:     Effort: Pulmonary effort is normal.  Abdominal:     Palpations: Abdomen is soft. Abdomen is not rigid.     Tenderness: There is no abdominal tenderness. There is no guarding or rebound.  Genitourinary:    Comments: Denies sores, lesions, vaginal bleeding; no pelvic pain; gu exam deferred at this time, vaginal self swab collected.   Skin:    General: Skin is warm and dry.  Neurological:     Mental Status: She is alert and oriented to person, place, and time.      UC Treatments / Results  Labs  (all labs ordered are listed, but only abnormal results are displayed) Labs Reviewed  POCT URINALYSIS DIP (DEVICE) - Abnormal; Notable for the following components:      Result Value   Hgb urine dipstick TRACE (*)    All other components within normal limits  POC URINE PREG, ED  POCT PREGNANCY, URINE  CERVICOVAGINAL ANCILLARY ONLY    EKG   Radiology No results found.  Procedures Procedures (including critical care time)  Medications Ordered in UC Medications - No data to display  Initial Impression / Assessment and Plan / UC Course  I have reviewed the triage vital signs and the nursing notes.  Pertinent labs & imaging results that were available during my care of the patient were reviewed by me and considered in my medical decision making (see chart for details).     Ovulation/ normal discharge suspected. Vaginal cytology collected and pending. Will notify of any positive findings and if any changes to treatment are needed.  If symptoms worsen or do not improve in the next week to return to be seen or to follow up with PCP. Marland Kitchen Patient verbalized understanding and agreeable to plan.   Final Clinical Impressions(s) / UC Diagnoses   Final diagnoses:  Vaginal discharge     Discharge Instructions     Ovulation typically occurs approximately 14 days after first day of menstrual cycle, and is precipitated by clear stretching "egg white" discharge.  Your discharge sounds consistent with this.  Will notify you of any positive findings from your vaginal sample and if any changes to treatment are needed.   If symptoms worsen or do not improve in the next week to return to be seen or to follow up with your PCP.     ED Prescriptions    None     Controlled Substance Prescriptions [place] Controlled Substance Registry consulted? Not Applicable   Zigmund Gottron, NP 10/22/18 [phone]

## 2018-10-22 NOTE — Discharge Instructions (Signed)
Ovulation typically occurs approximately 14 days after first day of menstrual cycle, and is precipitated by clear stretching "egg white" discharge.  Your discharge sounds consistent with this.  Will notify you of any positive findings from your vaginal sample and if any changes to treatment are needed.   If symptoms worsen or do not improve in the next week to return to be seen or to follow up with your PCP.

## 2018-10-22 NOTE — ED Triage Notes (Signed)
Patient has finished menstrual cycle last week and has noticed a clear discharge.  Denies pain. Frequent urination, but no pain

## 2018-10-25 LAB — CERVICOVAGINAL ANCILLARY ONLY
Bacterial vaginitis: NEGATIVE
Candida vaginitis: NEGATIVE
Chlamydia: NEGATIVE
Neisseria Gonorrhea: NEGATIVE
Trichomonas: NEGATIVE

## 2019-02-20 ENCOUNTER — Encounter (HOSPITAL_COMMUNITY): Payer: Self-pay | Admitting: Emergency Medicine

## 2019-02-20 ENCOUNTER — Emergency Department (HOSPITAL_COMMUNITY)
Admission: EM | Admit: 2019-02-20 | Discharge: 2019-02-21 | Disposition: A | Discharge status: Home / Self Care | Payer: No Typology Code available for payment source | Attending: Emergency Medicine | Admitting: Emergency Medicine

## 2019-02-20 ENCOUNTER — Other Ambulatory Visit: Payer: Self-pay

## 2019-02-20 DIAGNOSIS — R1084 Generalized abdominal pain: Secondary | ICD-10-CM | POA: Insufficient documentation

## 2019-02-20 DIAGNOSIS — R040 Epistaxis: Secondary | ICD-10-CM | POA: Insufficient documentation

## 2019-02-20 DIAGNOSIS — E876 Hypokalemia: Secondary | ICD-10-CM | POA: Insufficient documentation

## 2019-02-20 DIAGNOSIS — R197 Diarrhea, unspecified: Secondary | ICD-10-CM

## 2019-02-20 DIAGNOSIS — Z20828 Contact with and (suspected) exposure to other viral communicable diseases: Secondary | ICD-10-CM | POA: Diagnosis not present

## 2019-02-20 DIAGNOSIS — R112 Nausea with vomiting, unspecified: Secondary | ICD-10-CM | POA: Diagnosis present

## 2019-02-20 LAB — COMPREHENSIVE METABOLIC PANEL WITH GFR
ALT: 20 U/L (ref 0–44)
AST: 22 U/L (ref 15–41)
Albumin: 4.3 g/dL (ref 3.5–5.0)
Alkaline Phosphatase: 69 U/L (ref 38–126)
Anion gap: 12 (ref 5–15)
BUN: 11 mg/dL (ref 6–20)
CO2: 21 mmol/L — ABNORMAL LOW (ref 22–32)
Calcium: 9 mg/dL (ref 8.9–10.3)
Chloride: 99 mmol/L (ref 98–111)
Creatinine, Ser: 0.93 mg/dL (ref 0.44–1.00)
GFR calc Af Amer: >60 mL/min
GFR calc non Af Amer: >60 mL/min
Glucose, Bld: 139 mg/dL — ABNORMAL HIGH (ref 70–99)
Potassium: 3.2 mmol/L — ABNORMAL LOW (ref 3.5–5.1)
Sodium: 132 mmol/L — ABNORMAL LOW (ref 135–145)
Total Bilirubin: 0.7 mg/dL (ref 0.3–1.2)
Total Protein: 8.1 g/dL (ref 6.5–8.1)

## 2019-02-20 LAB — URINALYSIS, ROUTINE W REFLEX MICROSCOPIC
Bilirubin Urine: NEGATIVE
Glucose, UA: NEGATIVE mg/dL
Ketones, ur: NEGATIVE mg/dL
Leukocytes,Ua: NEGATIVE
Nitrite: NEGATIVE
Protein, ur: >=300 mg/dL — AB
Specific Gravity, Urine: 1.029 (ref 1.005–1.030)
pH: 5 (ref 5.0–8.0)

## 2019-02-20 LAB — I-STAT BETA HCG BLOOD, ED (MC, WL, AP ONLY): I-stat hCG, quantitative: <5 m[IU]/mL (ref <5)

## 2019-02-20 LAB — CBC
HCT: 46.7 % — ABNORMAL HIGH (ref 36.0–46.0)
Hemoglobin: 15.4 g/dL — ABNORMAL HIGH (ref 12.0–15.0)
MCH: 29.3 pg (ref 26.0–34.0)
MCHC: 33 g/dL (ref 30.0–36.0)
MCV: 88.8 fL (ref 80.0–100.0)
Platelets: 336 10*3/uL (ref 150–400)
RBC: 5.26 MIL/uL — ABNORMAL HIGH (ref 3.87–5.11)
RDW: 14.3 % (ref 11.5–15.5)
WBC: 9.9 10*3/uL (ref 4.0–10.5)
nRBC: 0 % (ref 0.0–0.2)

## 2019-02-20 LAB — LIPASE, BLOOD: Lipase: 65 U/L — ABNORMAL HIGH (ref 11–51)

## 2019-02-20 MED ORDER — SODIUM CHLORIDE 0.9% FLUSH
3.0000 mL | Freq: Once | INTRAVENOUS | Status: AC
  Administered 2019-02-21: 09:00:00 3 mL via INTRAVENOUS

## 2019-02-20 MED ORDER — ACETAMINOPHEN 325 MG PO TABS
650.0000 mg | ORAL_TABLET | Freq: Once | ORAL | Status: AC
  Administered 2019-02-20: 650 mg via ORAL
  Filled 2019-02-20: qty 2

## 2019-02-20 NOTE — ED Triage Notes (Signed)
Pt abd pain, nausea and vomiting started having nose bleed today.

## 2019-02-21 LAB — POC SARS CORONAVIRUS 2 AG -  ED: SARS Coronavirus 2 Ag: NEGATIVE

## 2019-02-21 MED ORDER — FAMOTIDINE IN NACL 20-0.9 MG/50ML-% IV SOLN
20.0000 mg | Freq: Once | INTRAVENOUS | Status: AC
  Administered 2019-02-21: 20 mg via INTRAVENOUS
  Filled 2019-02-21: qty 50

## 2019-02-21 MED ORDER — ONDANSETRON 4 MG PO TBDP: 4.0000 mg | ORAL_TABLET | Freq: Three times a day (TID) | ORAL | 0 refills | Status: DC | PRN

## 2019-02-21 MED ORDER — SODIUM CHLORIDE 0.9 % IV BOLUS
1000.0000 mL | Freq: Once | INTRAVENOUS | Status: AC
  Administered 2019-02-21: 1000 mL via INTRAVENOUS

## 2019-02-21 MED ORDER — ONDANSETRON HCL 4 MG/2ML IJ SOLN
4.0000 mg | Freq: Once | INTRAMUSCULAR | Status: AC
  Administered 2019-02-21: 09:00:00 4 mg via INTRAVENOUS
  Filled 2019-02-21: qty 2

## 2019-02-21 MED ORDER — FAMOTIDINE 20 MG PO TABS: 20.0000 mg | ORAL_TABLET | Freq: Two times a day (BID) | ORAL | 0 refills | Status: DC

## 2019-02-21 NOTE — Discharge Instructions (Signed)
You were seen in the emergency department for nausea vomiting and diarrhea, weakness and nosebleeds.  Your nosebleed did not recur while you were here.  You received some IV fluids and nausea and acid medication with improvement in your symptoms.  We are prescribing you some acid medication along with some nausea medication to use at home.  Please try to stay well-hydrated.  Follow-up with your doctor and return if any worsening symptoms.

## 2019-02-21 NOTE — ED Notes (Signed)
Pt discharge instructions and prescriptions reviewed with the patient. The patient verbalized understanding of both. Pt discharged. 

## 2019-02-21 NOTE — ED Notes (Signed)
Patients blood pressure has been running high all night

## 2019-02-21 NOTE — ED Notes (Signed)
Pt given water and graham crackers 

## 2019-02-21 NOTE — ED Provider Notes (Signed)
[facility] EMERGENCY DEPARTMENT Provider Note   CSN: [id number] Arrival date & time: 02/20/19  2119     History   Chief Complaint Chief Complaint  Patient presents with   Abdominal Pain   Epistaxis    HPI Barbara Yang is a 38 y.o. female.  She is presenting with complaints of 3 to 4 days of nausea vomiting diarrhea generalized abdominal pain with intermittent nosebleeds.  She said she was vomiting multiple times the first day or 2 but that has since stopped and she has been able to hold down a little bit of p.o.  Generalized abdominal pain 8 out of 10 at its most severe.  Diarrhea multiple episodes no blood from above or below.  No urinary symptoms.  Fever of 101 on arrival here.  No sore throat cough.  Last nosebleed was this morning.  Has stopped.  She said he used to drink heavier but stopped about a week ago because it was making her stomach hurt.     The history is provided by the patient.  Abdominal Pain Pain location:  Generalized Pain quality: cramping   Pain radiates to:  Does not radiate Pain severity:  Moderate Onset quality:  Gradual Timing:  Intermittent Progression:  Waxing and waning Chronicity:  New Context: not trauma   Relieved by:  None tried Worsened by:  Nothing Ineffective treatments:  None tried Associated symptoms: diarrhea, fever, nausea and vomiting   Associated symptoms: no chest pain, no chills, no constipation, no cough, no dysuria, no hematemesis, no hematochezia, no hematuria, no melena, no shortness of breath, no sore throat, no vaginal bleeding and no vaginal discharge   Epistaxis Associated symptoms: fever   Associated symptoms: no cough, no headaches and no sore throat     Past Medical History:  Diagnosis Date   Cough    non productive   Heartburn during pregnancy    takes Tums daily   Medical history non-contributory     Patient Active Problem List   Diagnosis Date Noted   Active labor 09/26/2014    NVD (normal vaginal delivery) 09/26/2014   Evaluate anatomy not seen on prior sonogram    Obesity affecting pregnancy, antepartum    Multiple marker screen positive for Down syndrome    AMA (advanced maternal age) multigravida 35+    [redacted] weeks gestation of pregnancy    Encounter for fetal anatomic survey    Abnormal quad screen    Advanced maternal age in multigravida    Pregnancy 04/23/2014   Lisfranc dislocation 04/22/2014    Past Surgical History:  Procedure Laterality Date   FOOT SURGERY     HARDWARE REMOVAL Left 09/09/2014   Procedure: HARDWARE REMOVAL LEFT FOOT ;  Surgeon: Myrene Galas, MD;  Location: [facility] OR;  Service: Orthopedics;  Laterality: Left;   HERNIA REPAIR     ORIF ANKLE FRACTURE Left 04/22/2014   Procedure: OPEN REDUCTION INTERNAL FIXATION (ORIF) LEFT LISFRANC FRACTURE;  Surgeon: Budd Palmer, MD;  Location: [facility] OR;  Service: Orthopedics;  Laterality: Left;   UMBILICAL HERNIA REPAIR       OB History    Gravida  7   Para  2   Term  2   Preterm  0   AB  4   Living  1     SAB  4   TAB  0   Ectopic  0   Multiple  0   Live Births  1  Home Medications    Prior to Admission medications   Not on File    Family History Family History  Problem Relation Age of Onset   Cancer Mother     Social History Social History   Tobacco Use   Smoking status: Never Smoker   Smokeless tobacco: Never Used  Substance Use Topics   Alcohol use: No   Drug use: No     Allergies   Patient has no known allergies.   Review of Systems Review of Systems  Constitutional: Positive for fever. Negative for chills.  HENT: Positive for nosebleeds. Negative for sore throat.   Eyes: Negative for visual disturbance.  Respiratory: Negative for cough and shortness of breath.   Cardiovascular: Negative for chest pain.  Gastrointestinal: Positive for abdominal pain, diarrhea, nausea and vomiting. Negative for constipation,  hematemesis, hematochezia and melena.  Genitourinary: Negative for dysuria, hematuria, vaginal bleeding and vaginal discharge.  Musculoskeletal: Negative for neck pain.  Skin: Negative for rash.  Neurological: Negative for headaches.     Physical Exam Updated Vital Signs BP (!) 140/109 (BP Location: Right Arm)    Pulse (!) 107    Temp 99.5 F (37.5 C) (Oral)    Resp 16    Ht 5\' 1"  (1.549 m)    Wt 76.2 kg    SpO2 100%    BMI 31.74 kg/m   Physical Exam Vitals signs and nursing note reviewed.  Constitutional:      General: She is not in acute distress.    Appearance: She is well-developed.  HENT:     Head: Normocephalic and atraumatic.     Nose: Nose normal.     Right Nostril: No epistaxis or septal hematoma.     Left Nostril: No epistaxis or septal hematoma.     Right Turbinates: Enlarged.     Left Turbinates: Enlarged.  Eyes:     Conjunctiva/sclera: Conjunctivae normal.  Neck:     Musculoskeletal: Neck supple.  Cardiovascular:     Rate and Rhythm: Normal rate and regular rhythm.     Heart sounds: No murmur.  Pulmonary:     Effort: Pulmonary effort is normal. No respiratory distress.     Breath sounds: Normal breath sounds.  Abdominal:     Palpations: Abdomen is soft.     Tenderness: There is no abdominal tenderness. There is no guarding or rebound.  Musculoskeletal: Normal range of motion.     Right lower leg: No edema.     Left lower leg: No edema.  Skin:    General: Skin is warm and dry.     Capillary Refill: Capillary refill takes less than 2 seconds.  Neurological:     General: No focal deficit present.     Mental Status: She is alert.      ED Treatments / Results  Labs (all labs ordered are listed, but only abnormal results are displayed) Labs Reviewed  LIPASE, BLOOD - Abnormal; Notable for the following components:      Result Value   Lipase 65 (*)    All other components within normal limits  COMPREHENSIVE METABOLIC PANEL - Abnormal; Notable for the  following components:   Sodium 132 (*)    Potassium 3.2 (*)    CO2 21 (*)    Glucose, Bld 139 (*)    All other components within normal limits  CBC - Abnormal; Notable for the following components:   RBC 5.26 (*)    Hemoglobin 15.4 (*)    HCT  46.7 (*)    All other components within normal limits  URINALYSIS, ROUTINE W REFLEX MICROSCOPIC - Abnormal; Notable for the following components:   Color, Urine AMBER (*)    APPearance HAZY (*)    Hgb urine dipstick MODERATE (*)    Protein, ur >=300 (*)    Bacteria, UA RARE (*)    All other components within normal limits  I-STAT BETA HCG BLOOD, ED ([facility], [facility], [facility] ONLY)  POC SARS CORONAVIRUS 2 AG -  ED    EKG None  Radiology No results found.  Procedures Procedures (including critical care time)  Medications Ordered in ED Medications  sodium chloride flush (NS) 0.9 % injection 3 mL (3 mLs Intravenous Given 02/21/19 0858)  acetaminophen (TYLENOL) tablet 650 mg (650 mg Oral Given 02/20/19 2137)  sodium chloride 0.9 % bolus 1,000 mL (0 mLs Intravenous Stopped 02/21/19 1108)  ondansetron (ZOFRAN) injection 4 mg (4 mg Intravenous Given 02/21/19 0855)  famotidine (PEPCID) IVPB 20 mg premix (0 mg Intravenous Stopped 02/21/19 0935)     Initial Impression / Assessment and Plan / ED Course  I have reviewed the triage vital signs and the nursing notes.  Pertinent labs & imaging results that were available during my care of the patient were reviewed by me and considered in my medical decision making (see chart for details).  Clinical Course as of Feb 20 1705  Sat Feb 21, 2019  0818 Patient here with 3 to 4 days of nausea vomiting diarrhea and intermittent nosebleed.  Differential includes a GE, Covid, less likely obstruction, diverticulitis, colitis, appendicitis.  Fairly benign exam and sounds like things are improving.  Did have SIRS criteria on arrival.  Normal white count of 9.  Hemoglobin elevated and higher than her baseline so likely volume  contracted.  Electrolytes showing normal renal function mildly low potassium of 3.2.  Lipase elevated at 65 nonspecific.  Urine concentrated and with some reds and whites.  Pregnancy negative.  Getting IV fluids Pepcid and Zofran, reeval.   [MB]  0940 Medications given and IV fluids infusing.  Patient states she is still not ready to try any p.o. yet.   [MB]  1050 Tolerating p.o. now.  Symptoms improved.  Will send home with Zofran and acid medication.   [MB]    Clinical Course User Index [MB] Terrilee FilesButler, Jefferson Fullam C, MD   Barbara AdasJennifer Chokshi was evaluated in Emergency Department on 02/21/2019 for the symptoms described in the history of present illness. She was evaluated in the context of the global COVID-19 pandemic, which necessitated consideration that the patient might be at risk for infection with the SARS-CoV-2 virus that causes COVID-19. Institutional protocols and algorithms that pertain to the evaluation of patients at risk for COVID-19 are in a state of rapid change based on information released by regulatory bodies including the CDC and federal and state organizations. These policies and algorithms were followed during the patient's care in the ED.      Final Clinical Impressions(s) / ED Diagnoses   Final diagnoses:  Nausea vomiting and diarrhea  Hypokalemia  Right-sided epistaxis    ED Discharge Orders         Ordered    famotidine (PEPCID) 20 MG tablet  2 times daily     02/21/19 1052    ondansetron (ZOFRAN ODT) 4 MG disintegrating tablet  Every 8 hours PRN     02/21/19 1052           Terrilee FilesButler, Mercadez Heitman C, MD 02/21/19 1707

## 2019-02-23 ENCOUNTER — Encounter: Payer: Self-pay | Admitting: Gastroenterology

## 2019-04-03 ENCOUNTER — Encounter: Payer: Self-pay | Admitting: Gastroenterology

## 2019-04-03 ENCOUNTER — Ambulatory Visit (INDEPENDENT_AMBULATORY_CARE_PROVIDER_SITE_OTHER): Payer: No Typology Code available for payment source | Admitting: Gastroenterology

## 2019-04-03 VITALS — BP 90/70 | HR 80 | Temp 98.8°F | Ht 61.5 in | Wt 174.0 lb

## 2019-04-03 DIAGNOSIS — R112 Nausea with vomiting, unspecified: Secondary | ICD-10-CM | POA: Diagnosis not present

## 2019-04-03 DIAGNOSIS — K5900 Constipation, unspecified: Secondary | ICD-10-CM | POA: Diagnosis not present

## 2019-04-03 DIAGNOSIS — Z01818 Encounter for other preprocedural examination: Secondary | ICD-10-CM

## 2019-04-03 MED ORDER — SUPREP BOWEL PREP KIT 17.5-3.13-1.6 GM/177ML PO SOLN: 1.0000 | ORAL | 0 refills | Status: DC

## 2019-04-03 NOTE — Progress Notes (Signed)
HPI: This is a very pleasant 39 year old woman who was self-referred  Chief complaint is chronic constipation, intermittent nausea and vomiting  she has had issues with constipation for many years.  They seem to be getting worse lately.  She can sometimes go several days without moving her bowels.  She has tried MiraLAX and suppositories generally on a as needed basis only.  She does not see blood in her stool.  Colon cancer does not run in her family.  She also has intermittent nausea and vomiting.  These tend to occur after she drinks a fair amount of alcohol.  She used to drink liquor, would go through a standard size bottle in 2 or 3 days.  She has since changed to drinking mainly wine.  She takes Aleve with her menstrual cycle and ibuprofen it sounds like every 3 or 4 days for headaches.  She was in the emergency room about a month ago with nausea and vomiting.  Old Data Reviewed: Reviewing her epic record it looks like she has been in the emergency room 8 times in the past 2 years for a variety of illnesses.  Most recent was about a month ago for nausea and vomiting, fever to 1010 in ER, nosebleed.  Blood work was performed.  CBC was normal, complete metabolic profile was normal except for slightly low potassium at 3.2 and low sodium at 132.  Lipase was slightly above normal at 65.  Urinalysis was normal except for proteinuria and rare bacteria this was not felt to represent a UTI by the emergency room.  Her coronavirus testing was negative.  Pregnancy testing was negative as well.   Review of systems: Pertinent positive and negative review of systems were noted in the above HPI section. All other review negative.   Past Medical History:  Diagnosis Date  . Cough    non productive  . Heartburn during pregnancy    takes Tums daily  . Medical history non-contributory     Past Surgical History:  Procedure Laterality Date  . HARDWARE REMOVAL Left 09/09/2014   Procedure: HARDWARE  REMOVAL LEFT FOOT ;  Surgeon: Altamese Bunker Hill, MD;  Location: [facility];  Service: Orthopedics;  Laterality: Left;  . ORIF ANKLE FRACTURE Left 04/22/2014   Procedure: OPEN REDUCTION INTERNAL FIXATION (ORIF) LEFT LISFRANC FRACTURE;  Surgeon: Rozanna Box, MD;  Location: [facility];  Service: Orthopedics;  Laterality: Left;  . UMBILICAL HERNIA REPAIR      Current Outpatient Medications  Medication Sig Dispense Refill  . ACZONE 7.5 % GEL Apply 1 application topically every morning.    . famotidine (PEPCID) 20 MG tablet Take 1 tablet (20 mg total) by mouth 2 (two) times daily. (Patient not taking: Reported on 04/03/2019) 30 tablet 0   No current facility-administered medications for this visit.    Allergies as of 04/03/2019  . (No Known Allergies)    Family History  Problem Relation Age of Onset  . Breast cancer Mother   . Diabetes Mother   . Kidney disease Mother   . Diabetes Maternal Grandmother   . Diabetes Maternal Aunt     Social History   Socioeconomic History  . Marital status: Married    Spouse name: Not on file  . Number of children: 2  . Years of education: Not on file  . Highest education level: Not on file  Occupational History  . Occupation: [employer]  Tobacco Use  . Smoking status: Never Smoker  . Smokeless tobacco: Never Used  Substance and Sexual Activity  . Alcohol use: Yes    Comment: 2 per day  . Drug use: No  . Sexual activity: Yes    Birth control/protection: None  Other Topics Concern  . Not on file  Social History Narrative  . Not on file   Social Determinants of Health   Financial Resource Strain:   . Difficulty of Paying Living Expenses: Not on file  Food Insecurity:   . Worried About Programme researcher, broadcasting/film/video in the Last Year: Not on file  . Ran Out of Food in the Last Year: Not on file  Transportation Needs:   . Lack of Transportation (Medical): Not on file  . Lack of Transportation (Non-Medical): Not on file  Physical Activity:   . Days of Exercise per  Week: Not on file  . Minutes of Exercise per Session: Not on file  Stress:   . Feeling of Stress : Not on file  Social Connections:   . Frequency of Communication with Friends and Family: Not on file  . Frequency of Social Gatherings with Friends and Family: Not on file  . Attends Religious Services: Not on file  . Active Member of Clubs or Organizations: Not on file  . Attends Banker Meetings: Not on file  . Marital Status: Not on file  Intimate Partner Violence:   . Fear of Current or Ex-Partner: Not on file  . Emotionally Abused: Not on file  . Physically Abused: Not on file  . Sexually Abused: Not on file     Physical Exam: Temp 98.8 F (37.1 C)   Ht 5' 1.5" (1.562 m) Comment: height measured without shoes  Wt 174 lb (78.9 kg)   BMI 32.34 kg/m  Constitutional: Obese Psychiatric: alert and oriented x3 Eyes: extraocular movements intact Mouth: oral pharynx moist, no lesions Neck: supple no lymphadenopathy Cardiovascular: heart regular rate and rhythm Lungs: clear to auscultation bilaterally Abdomen: soft, nontender, nondistended, no obvious ascites, no peritoneal signs, normal bowel sounds Extremities: no lower extremity edema bilaterally Skin: no lesions on visible extremities   Assessment and plan: 39 y.o. female with intermittent nausea vomiting, constipation  She is quite concerned about the possibility of underlying cancer.  I think it is unlikely however I recommended we proceed with a colonoscopy and upper endoscopy at her soonest convenience to be more certain.  In the meantime she is going to get on a fiber supplement on a daily basis to see if that can help even out her pushing and straining, constipation which in turn I hope will also help some of her intermittent nausea and vomiting.  We spent a bit of time talking about her alcohol intake.  I think she probably drinks too much overall.  I explained to her that whether it is wine or spirits she  should probably never have more than 1 or 2 drinks on a daily basis and even that is debatable.  I see no reason for any further blood tests or imaging studies prior to that colonoscopy and upper endoscopy above.    Please see the "Patient Instructions" section for addition details about the plan.   Rob Bunting, MD [facility] 04/03/2019, 2:57 PM

## 2019-04-03 NOTE — Patient Instructions (Addendum)
If you are age 41 or younger, your body mass index should be between 19-25. Your Body mass index is 32.34 kg/m. If this is out of the aformentioned range listed, please consider follow up with your Primary Care Provider.   You have been scheduled for an endoscopy and colonoscopy. Please follow the written instructions given to you at your visit today. Please pick up your prep supplies at the pharmacy within the next 1-3 days. If you use inhalers (even only as needed), please bring them with you on the day of your procedure.  Please start taking citrucel (orange flavored) powder fiber supplement.  This may cause some bloating at first but that usually goes away. Begin with a small spoonful and work your way up to a large, heaping spoonful daily over a week.  Watch your alcohol intake.   Due to recent changes in healthcare laws, you may see the results of your imaging and laboratory studies on MyChart before your provider has had a chance to review them.  We understand that in some cases there may be results that are confusing or concerning to you. Not all laboratory results come back in the same time frame and the provider may be waiting for multiple results in order to interpret others.  Please give [place] 48 hours in order for your provider to thoroughly review all the results before contacting the office for clarification of your results.   Thank you for choosing me and [facility].   Dr.Jacobs

## 2019-04-10 ENCOUNTER — Encounter: Payer: No Typology Code available for payment source | Admitting: Gastroenterology

## 2019-05-18 ENCOUNTER — Ambulatory Visit
Admission: EM | Admit: 2019-05-18 | Discharge: 2019-05-18 | Disposition: A | Discharge status: Home / Self Care | Payer: No Typology Code available for payment source | Attending: Physician Assistant | Admitting: Physician Assistant

## 2019-05-18 DIAGNOSIS — Z113 Encounter for screening for infections with a predominantly sexual mode of transmission: Secondary | ICD-10-CM | POA: Insufficient documentation

## 2019-05-18 DIAGNOSIS — Z760 Encounter for issue of repeat prescription: Secondary | ICD-10-CM | POA: Insufficient documentation

## 2019-05-18 MED ORDER — FAMOTIDINE 20 MG PO TABS: 20.0000 mg | ORAL_TABLET | Freq: Two times a day (BID) | ORAL | 0 refills | Status: DC | PRN

## 2019-05-18 NOTE — ED Provider Notes (Signed)
[name]    CSN: [id number] Arrival date & time: 05/18/19  6295      History   Chief Complaint Chief Complaint  Patient presents with  . SEXUALLY TRANSMITTED DISEASE    HPI Barbara Yang is a 39 y.o. female.   39 year old female comes in for STD testing. No known exposures. She is asymptomatic. Denies urinary symptoms such as frequency, dysuria, hematuria.  Denies vaginal discharge, itching, pain.  Denies fever, chills, body aches.  Denies abdominal pain, nausea, vomiting, diarrhea.  LMP 05/11/2019.  Sexually active with one female partner.  States partner may have other partners.  Patient states history of GERD, symptoms controlled on pepcid/diet change. Went off pepcid after symptoms resolved.  Recently noticed increasing heartburn and would like refill of pepcid. Noticed increase in symptoms after spicy food/caffeine.      Past Medical History:  Diagnosis Date  . Cough    non productive  . Heartburn during pregnancy    takes Tums daily  . Medical history non-contributory     Patient Active Problem List   Diagnosis Date Noted  . Active labor 09/26/2014  . NVD (normal vaginal delivery) 09/26/2014  . Evaluate anatomy not seen on prior sonogram   . Obesity affecting pregnancy, antepartum   . Multiple marker screen positive for Down syndrome   . AMA (advanced maternal age) multigravida 35+   . [redacted] weeks gestation of pregnancy   . Encounter for fetal anatomic survey   . Abnormal quad screen   . Advanced maternal age in multigravida   . Pregnancy 04/23/2014  . Lisfranc dislocation 04/22/2014    Past Surgical History:  Procedure Laterality Date  . HARDWARE REMOVAL Left 09/09/2014   Procedure: HARDWARE REMOVAL LEFT FOOT ;  Surgeon: Myrene Galas, MD;  Location: [facility] OR;  Service: Orthopedics;  Laterality: Left;  . ORIF ANKLE FRACTURE Left 04/22/2014   Procedure: OPEN REDUCTION INTERNAL FIXATION (ORIF) LEFT LISFRANC FRACTURE;  Surgeon: Budd Palmer, MD;   Location: [facility] OR;  Service: Orthopedics;  Laterality: Left;  . UMBILICAL HERNIA REPAIR      OB History    Gravida  7   Para  2   Term  2   Preterm  0   AB  4   Living  1     SAB  4   TAB  0   Ectopic  0   Multiple  0   Live Births  1            Home Medications    Prior to Admission medications   Medication Sig Start Date End Date Taking? Authorizing Provider  ACZONE 7.5 % GEL Apply 1 application topically every morning. 12/19/18   [provider]  famotidine (PEPCID) 20 MG tablet Take 1 tablet (20 mg total) by mouth 2 (two) times daily as needed for heartburn or indigestion. 05/18/19   Belinda Fisher, PA-C    Family History Family History  Problem Relation Age of Onset  . Breast cancer Mother   . Diabetes Mother   . Kidney disease Mother   . Diabetes Maternal Grandmother   . Diabetes Maternal Aunt     Social History Social History   Tobacco Use  . Smoking status: Never Smoker  . Smokeless tobacco: Never Used  Substance Use Topics  . Alcohol use: Yes    Comment: 2 per day  . Drug use: No     Allergies   Patient has no known allergies.  Review of Systems Review of Systems  Reason unable to perform ROS: See HPI as above.     Physical Exam Triage Vital Signs ED Triage Vitals  Enc Vitals Group     BP 05/18/19 0943 137/85     Pulse Rate 05/18/19 0943 85     Resp 05/18/19 0943 16     Temp 05/18/19 0943 99.1 F (37.3 C)     Temp Source 05/18/19 0943 Oral     SpO2 05/18/19 0943 96 %     Weight --      Height --      Head Circumference --      Peak Flow --      Pain Score 05/18/19 0954 0     Pain Loc --      Pain Edu? --      Excl. in [place]? --    No data found.  Updated Vital Signs BP 137/85 (BP Location: Left Arm)   Pulse 85   Temp 99.1 F (37.3 C) (Oral)   Resp 16   LMP 05/11/2019   SpO2 96%   Physical Exam Constitutional:      General: She is not in acute distress.    Appearance: She is well-developed. She is not  ill-appearing, toxic-appearing or diaphoretic.  HENT:     Head: Normocephalic and atraumatic.  Eyes:     Conjunctiva/sclera: Conjunctivae normal.     Pupils: Pupils are equal, round, and reactive to light.  Cardiovascular:     Rate and Rhythm: Normal rate and regular rhythm.  Pulmonary:     Effort: Pulmonary effort is normal. No respiratory distress.     Comments: LCTAB Abdominal:     General: Bowel sounds are normal.     Palpations: Abdomen is soft.     Tenderness: There is no abdominal tenderness. There is no right CVA tenderness, left CVA tenderness, guarding or rebound.  Musculoskeletal:     Cervical back: Normal range of motion and neck supple.  Skin:    General: Skin is warm and dry.  Neurological:     Mental Status: She is alert and oriented to person, place, and time.  Psychiatric:        Behavior: Behavior normal.        Judgment: Judgment normal.      UC Treatments / Results  Labs (all labs ordered are listed, but only abnormal results are displayed) Labs Reviewed  CERVICOVAGINAL ANCILLARY ONLY    EKG   Radiology No results found.  Procedures Procedures (including critical care time)  Medications Ordered in UC Medications - No data to display  Initial Impression / Assessment and Plan / UC Course  I have reviewed the triage vital signs and the nursing notes.  Pertinent labs & imaging results that were available during my care of the patient were reviewed by me and considered in my medical decision making (see chart for details).    Cytology sent, patient will be contacted with any positive results that require additional treatment. Famotidine refilled. Patient established with GI, to follow up with GI for further refills or management if needed. Return precautions given.   Final Clinical Impressions(s) / UC Diagnoses   Final diagnoses:  Screen for STD (sexually transmitted disease)  Medication refill   ED Prescriptions    Medication Sig Dispense  Auth. Provider   famotidine (PEPCID) 20 MG tablet Take 1 tablet (20 mg total) by mouth 2 (two) times daily as needed for heartburn or indigestion. [place]  tablet Belinda Fisher, PA-C     PDMP not reviewed this encounter.   Belinda Fisher, PA-C 05/18/19 1029

## 2019-05-18 NOTE — ED Triage Notes (Signed)
Pt requesting STD testing, denies any symptoms

## 2019-05-18 NOTE — Discharge Instructions (Addendum)
Cytology sent, you will be contacted with any positive results that requires further treatment. Monitor for any worsening of symptoms, fever, abdominal pain, nausea, vomiting, to follow up for reevaluation.  Famotidine refilled today. Can take as needed for symptoms. Follow up with GI for further evaluation/management as needed.

## 2019-05-19 LAB — CERVICOVAGINAL ANCILLARY ONLY
Chlamydia: NEGATIVE
Neisseria Gonorrhea: NEGATIVE
Trichomonas: NEGATIVE

## 2019-10-22 ENCOUNTER — Telehealth: Payer: Self-pay | Admitting: Genetic Counselor

## 2019-10-22 NOTE — Telephone Encounter (Signed)
Received a genetic counseling referral from Dr. Amado Nash for fhx of breast cancer. Barbara Yang has been cld and scheduled to see Cari on 8/16 at 1pm. Pt aware to arrive 15 minutes early.

## 2019-11-02 ENCOUNTER — Inpatient Hospital Stay: Payer: No Typology Code available for payment source

## 2019-11-02 ENCOUNTER — Inpatient Hospital Stay
Payer: No Typology Code available for payment source | Attending: Obstetrics and Gynecology | Admitting: Genetic Counselor

## 2020-01-07 ENCOUNTER — Other Ambulatory Visit: Payer: Self-pay

## 2020-01-07 ENCOUNTER — Ambulatory Visit
Admission: EM | Admit: 2020-01-07 | Discharge: 2020-01-07 | Disposition: A | Discharge status: Home / Self Care | Payer: No Typology Code available for payment source | Attending: Physician Assistant | Admitting: Physician Assistant

## 2020-01-07 DIAGNOSIS — N760 Acute vaginitis: Secondary | ICD-10-CM | POA: Diagnosis not present

## 2020-01-07 MED ORDER — FLUCONAZOLE 150 MG PO TABS: 150.0000 mg | ORAL_TABLET | Freq: Every day | ORAL | 0 refills | Status: DC

## 2020-01-07 NOTE — ED Provider Notes (Signed)
[name]    CSN: [id number] Arrival date & time: 01/07/20  1158      History   Chief Complaint Chief Complaint  Patient presents with  . Vaginal Itching    HPI Barbara Yang is a 39 y.o. female.   39 year old female comes in for vaginal irritation. Symptoms first started after shaving 1 week ago. Took diflucan with mild relief. However, still having vaginal itching/irritation with mild discharge. Denies vaginal odor. Denies fever, abdominal pain.      Past Medical History:  Diagnosis Date  . Cough    non productive  . Heartburn during pregnancy    takes Tums daily  . Medical history non-contributory     Patient Active Problem List   Diagnosis Date Noted  . Active labor 09/26/2014  . NVD (normal vaginal delivery) 09/26/2014  . Evaluate anatomy not seen on prior sonogram   . Obesity affecting pregnancy, antepartum   . Multiple marker screen positive for Down syndrome   . AMA (advanced maternal age) multigravida 35+   . [redacted] weeks gestation of pregnancy   . Encounter for fetal anatomic survey   . Abnormal quad screen   . Advanced maternal age in multigravida   . Pregnancy 04/23/2014  . Lisfranc dislocation 04/22/2014    Past Surgical History:  Procedure Laterality Date  . HARDWARE REMOVAL Left 09/09/2014   Procedure: HARDWARE REMOVAL LEFT FOOT ;  Surgeon: Myrene Galas, MD;  Location: [facility] OR;  Service: Orthopedics;  Laterality: Left;  . ORIF ANKLE FRACTURE Left 04/22/2014   Procedure: OPEN REDUCTION INTERNAL FIXATION (ORIF) LEFT LISFRANC FRACTURE;  Surgeon: Budd Palmer, MD;  Location: [facility] OR;  Service: Orthopedics;  Laterality: Left;  . UMBILICAL HERNIA REPAIR      OB History    Gravida  7   Para  2   Term  2   Preterm  0   AB  4   Living  1     SAB  4   TAB  0   Ectopic  0   Multiple  0   Live Births  1            Home Medications    Prior to Admission medications   Medication Sig Start Date End Date Taking?  Authorizing Provider  fluconazole (DIFLUCAN) 150 MG tablet Take 1 tablet (150 mg total) by mouth daily. Take second dose 72 hours later if symptoms still persists. 01/07/20   Belinda Fisher, PA-C    Family History Family History  Problem Relation Age of Onset  . Breast cancer Mother   . Diabetes Mother   . Kidney disease Mother   . Diabetes Maternal Grandmother   . Diabetes Maternal Aunt     Social History Social History   Tobacco Use  . Smoking status: Never Smoker  . Smokeless tobacco: Never Used  Vaping Use  . Vaping Use: Never used  Substance Use Topics  . Alcohol use: Yes    Comment: 2 per day  . Drug use: No     Allergies   Patient has no known allergies.   Review of Systems Review of Systems  Reason unable to perform ROS: See HPI as above.     Physical Exam Triage Vital Signs ED Triage Vitals  Enc Vitals Group     BP 01/07/20 1210 117/81     Pulse Rate 01/07/20 1210 77     Resp 01/07/20 1210 18     Temp 01/07/20  1210 98.7 F (37.1 C)     Temp Source 01/07/20 1210 Oral     SpO2 01/07/20 1210 95 %     Weight --      Height --      Head Circumference --      Peak Flow --      Pain Score 01/07/20 1211 0     Pain Loc --      Pain Edu? --      Excl. in [place]? --    No data found.  Updated Vital Signs BP 117/81 (BP Location: Right Arm)   Pulse 77   Temp 98.7 F (37.1 C) (Oral)   Resp 18   LMP 12/18/2019   SpO2 95%   Visual Acuity Right Eye Distance:   Left Eye Distance:   Bilateral Distance:    Right Eye Near:   Left Eye Near:    Bilateral Near:     Physical Exam Constitutional:      General: She is not in acute distress.    Appearance: Normal appearance. She is well-developed. She is not toxic-appearing or diaphoretic.  HENT:     Head: Normocephalic and atraumatic.  Eyes:     Conjunctiva/sclera: Conjunctivae normal.     Pupils: Pupils are equal, round, and reactive to light.  Pulmonary:     Effort: Pulmonary effort is normal. No  respiratory distress.  Musculoskeletal:     Cervical back: Normal range of motion and neck supple.  Skin:    General: Skin is warm and dry.  Neurological:     Mental Status: She is alert and oriented to person, place, and time.      UC Treatments / Results  Labs (all labs ordered are listed, but only abnormal results are displayed) Labs Reviewed  CERVICOVAGINAL ANCILLARY ONLY    EKG   Radiology No results found.  Procedures Procedures (including critical care time)  Medications Ordered in UC Medications - No data to display  Initial Impression / Assessment and Plan / UC Course  I have reviewed the triage vital signs and the nursing notes.  Pertinent labs & imaging results that were available during my care of the patient were reviewed by me and considered in my medical decision making (see chart for details).    Diflucan as directed. Cytology sent. Return precautions given.  Final Clinical Impressions(s) / UC Diagnoses   Final diagnoses:  Acute vaginitis    ED Prescriptions    Medication Sig Dispense Auth. Provider   fluconazole (DIFLUCAN) 150 MG tablet Take 1 tablet (150 mg total) by mouth daily. Take second dose 72 hours later if symptoms still persists. 2 tablet Belinda Fisher, PA-C     PDMP not reviewed this encounter.   Belinda Fisher, PA-C 01/07/20 1237

## 2020-01-07 NOTE — Discharge Instructions (Signed)
You were treated empirically for yeast. Diflucan as directed. Cytology sent, you will be contacted with any positive results that requires further treatment. Refrain from sexual activity and alcohol use for the next 7 days. Monitor for any worsening of symptoms, fever, abdominal pain, nausea, vomiting, to follow up for reevaluation. ° °

## 2020-01-07 NOTE — ED Triage Notes (Signed)
Pt c/o vaginal irritation since last Monday after shaving. Pt states was treat for a yeast infection last week but on took 1 diflucan. States still has a white discharge with no odor.

## 2020-01-11 LAB — CERVICOVAGINAL ANCILLARY ONLY
Bacterial Vaginitis (gardnerella): POSITIVE — AB
Chlamydia: NEGATIVE
Comment: NEGATIVE
Comment: NEGATIVE
Comment: NEGATIVE
Comment: NORMAL
Neisseria Gonorrhea: NEGATIVE
Trichomonas: NEGATIVE

## 2020-01-12 ENCOUNTER — Telehealth (HOSPITAL_COMMUNITY): Payer: Self-pay | Admitting: Emergency Medicine

## 2020-01-12 MED ORDER — METRONIDAZOLE 500 MG PO TABS: 500.0000 mg | ORAL_TABLET | Freq: Two times a day (BID) | ORAL | 0 refills | Status: DC

## 2020-05-03 ENCOUNTER — Other Ambulatory Visit: Payer: No Typology Code available for payment source

## 2020-05-03 DIAGNOSIS — Z20822 Contact with and (suspected) exposure to covid-19: Secondary | ICD-10-CM

## 2020-05-04 LAB — SARS-COV-2, NAA 2 DAY TAT

## 2020-05-04 LAB — NOVEL CORONAVIRUS, NAA: SARS-CoV-2, NAA: DETECTED — AB

## 2020-05-05 ENCOUNTER — Telehealth: Payer: Self-pay | Admitting: Family

## 2020-05-05 NOTE — Telephone Encounter (Signed)
Called to discuss with patient about COVID-19 symptoms and the use of one of the available treatments for those with mild to moderate Covid symptoms and at a high risk of hospitalization.  Pt appears to qualify for outpatient treatment due to co-morbid conditions and/or a member of an at-risk group in accordance with the FDA Emergency Use Authorization.    Symptom onset: 05/04/20 Vaccinated: Yes Booster? No  Immunocompromised?  Qualifiers: BMI 32   Current symptoms include nasal and chest congestion. Vaccinated without booster. Tested positive on 2/16. Discussed the risks and benefits of treatment with Sotrovimab and she wishes to hold off and treat symptomatically at this time. Advised if she worsens or changes her mind she can call our call back number which was provided.   Barbara Eke, NP 05/05/2020 2:33 PM

## 2020-08-01 ENCOUNTER — Encounter: Payer: Self-pay | Admitting: Emergency Medicine

## 2020-08-01 ENCOUNTER — Other Ambulatory Visit: Payer: Self-pay

## 2020-08-01 ENCOUNTER — Ambulatory Visit
Admission: EM | Admit: 2020-08-01 | Discharge: 2020-08-01 | Disposition: A | Discharge status: Home / Self Care | Payer: No Typology Code available for payment source | Attending: Physician Assistant | Admitting: Physician Assistant

## 2020-08-01 DIAGNOSIS — Z113 Encounter for screening for infections with a predominantly sexual mode of transmission: Secondary | ICD-10-CM | POA: Insufficient documentation

## 2020-08-01 NOTE — ED Provider Notes (Signed)
[name]    CSN: [id number] Arrival date & time: 08/01/20  1313      History   Chief Complaint Chief Complaint  Patient presents with  . Vaginal Itching    HPI Barbara Yang is a 40 y.o. female.   Pt presents for STI screening.  Reports a recent sexual partner was not monogamous.  She denies sx at this time.      Past Medical History:  Diagnosis Date  . Cough    non productive  . Heartburn during pregnancy    takes Tums daily  . Medical history non-contributory     Patient Active Problem List   Diagnosis Date Noted  . Active labor 09/26/2014  . NVD (normal vaginal delivery) 09/26/2014  . Evaluate anatomy not seen on prior sonogram   . Obesity affecting pregnancy, antepartum   . Multiple marker screen positive for Down syndrome   . AMA (advanced maternal age) multigravida 35+   . [redacted] weeks gestation of pregnancy   . Encounter for fetal anatomic survey   . Abnormal quad screen   . Advanced maternal age in multigravida   . Pregnancy 04/23/2014  . Lisfranc dislocation 04/22/2014    Past Surgical History:  Procedure Laterality Date  . HARDWARE REMOVAL Left 09/09/2014   Procedure: HARDWARE REMOVAL LEFT FOOT ;  Surgeon: Myrene Galas, MD;  Location: [facility] OR;  Service: Orthopedics;  Laterality: Left;  . ORIF ANKLE FRACTURE Left 04/22/2014   Procedure: OPEN REDUCTION INTERNAL FIXATION (ORIF) LEFT LISFRANC FRACTURE;  Surgeon: Budd Palmer, MD;  Location: [facility] OR;  Service: Orthopedics;  Laterality: Left;  . UMBILICAL HERNIA REPAIR      OB History    Gravida  7   Para  2   Term  2   Preterm  0   AB  4   Living  1     SAB  4   IAB  0   Ectopic  0   Multiple  0   Live Births  1            Home Medications    Prior to Admission medications   Medication Sig Start Date End Date Taking? Authorizing Provider  fluconazole (DIFLUCAN) 150 MG tablet Take 1 tablet (150 mg total) by mouth daily. Take second dose 72 hours later if  symptoms still persists. Patient not taking: Reported on 08/01/2020 01/07/20   Belinda Fisher, PA-C  metroNIDAZOLE (FLAGYL) 500 MG tablet Take 1 tablet (500 mg total) by mouth 2 (two) times daily. Patient not taking: Reported on 08/01/2020 01/12/20   Merrilee Jansky, MD    Family History Family History  Problem Relation Age of Onset  . Breast cancer Mother   . Diabetes Mother   . Kidney disease Mother   . Diabetes Maternal Grandmother   . Diabetes Maternal Aunt     Social History Social History   Tobacco Use  . Smoking status: Never Smoker  . Smokeless tobacco: Never Used  Vaping Use  . Vaping Use: Never used  Substance Use Topics  . Alcohol use: Yes    Comment: 2 per day  . Drug use: No     Allergies   Patient has no known allergies.   Review of Systems Review of Systems  Constitutional: Negative for chills and fever.  HENT: Negative for ear pain and sore throat.   Eyes: Negative for pain and visual disturbance.  Respiratory: Negative for cough and shortness of breath.  Cardiovascular: Negative for chest pain and palpitations.  Gastrointestinal: Negative for abdominal pain and vomiting.  Genitourinary: Negative for dysuria and hematuria.  Musculoskeletal: Negative for arthralgias and back pain.  Skin: Negative for color change and rash.  Neurological: Negative for seizures and syncope.  All other systems reviewed and are negative.    Physical Exam Triage Vital Signs ED Triage Vitals  Enc Vitals Group     BP 08/01/20 1518 139/84     Pulse Rate 08/01/20 1518 100     Resp 08/01/20 1518 20     Temp 08/01/20 1518 (!) 100.4 F (38 C)     Temp Source 08/01/20 1518 Oral     SpO2 08/01/20 1518 96 %     Weight --      Height --      Head Circumference --      Peak Flow --      Pain Score 08/01/20 1516 0     Pain Loc --      Pain Edu? --      Excl. in [place]? --    No data found.  Updated Vital Signs BP 139/84 (BP Location: Right Arm)   Pulse 100   Temp (!)  100.4 F (38 C) (Oral)   Resp 20   LMP 07/25/2020   SpO2 96%   Visual Acuity Right Eye Distance:   Left Eye Distance:   Bilateral Distance:    Right Eye Near:   Left Eye Near:    Bilateral Near:     Physical Exam Vitals and nursing note reviewed.  Constitutional:      General: She is not in acute distress.    Appearance: She is well-developed.  HENT:     Head: Normocephalic and atraumatic.  Eyes:     Conjunctiva/sclera: Conjunctivae normal.  Cardiovascular:     Rate and Rhythm: Normal rate and regular rhythm.     Heart sounds: No murmur heard.   Pulmonary:     Effort: Pulmonary effort is normal. No respiratory distress.     Breath sounds: Normal breath sounds.  Abdominal:     Palpations: Abdomen is soft.     Tenderness: There is no abdominal tenderness.  Musculoskeletal:     Cervical back: Neck supple.  Skin:    General: Skin is warm and dry.  Neurological:     Mental Status: She is alert.      UC Treatments / Results  Labs (all labs ordered are listed, but only abnormal results are displayed) Labs Reviewed  CERVICOVAGINAL ANCILLARY ONLY    EKG   Radiology No results found.  Procedures Procedures (including critical care time)  Medications Ordered in UC Medications - No data to display  Initial Impression / Assessment and Plan / UC Course  I have reviewed the triage vital signs and the nursing notes.  Pertinent labs & imaging results that were available during my care of the patient were reviewed by me and considered in my medical decision making (see chart for details).  Clinical Course as of 08/01/20 1534  Mon Aug 01, 2020  1534 Temp(!): 100.4 F (38 C) [JZ]    Clinical Course User Index [JZ] Jodell Cipro, PA-C    Temp on recheck 98.9.  Labs pending, will treat based on test results.  Discussed safe sex practices.   Final Clinical Impressions(s) / UC Diagnoses   Final diagnoses:  Routine screening for STI (sexually transmitted  infection)     Discharge Instructions  Test results pending, will treat based on results.     ED Prescriptions    None     PDMP not reviewed this encounter.   Jodell Cipro, PA-C 08/01/20 1535

## 2020-08-01 NOTE — Discharge Instructions (Addendum)
Test results pending, will treat based on results.

## 2020-08-01 NOTE — ED Triage Notes (Signed)
Vaginal itching, no additional discharge onset 2 weeks ago

## 2020-08-02 LAB — CERVICOVAGINAL ANCILLARY ONLY
Bacterial Vaginitis (gardnerella): POSITIVE — AB
Candida Glabrata: NEGATIVE
Candida Vaginitis: NEGATIVE
Chlamydia: NEGATIVE
Comment: NEGATIVE
Comment: NEGATIVE
Comment: NEGATIVE
Comment: NEGATIVE
Comment: NEGATIVE
Comment: NORMAL
Neisseria Gonorrhea: NEGATIVE
Trichomonas: NEGATIVE

## 2020-08-03 ENCOUNTER — Telehealth (HOSPITAL_COMMUNITY): Payer: Self-pay | Admitting: Emergency Medicine

## 2020-08-03 MED ORDER — METRONIDAZOLE 500 MG PO TABS: 500.0000 mg | ORAL_TABLET | Freq: Two times a day (BID) | ORAL | 0 refills | Status: DC

## 2021-03-07 ENCOUNTER — Emergency Department (HOSPITAL_COMMUNITY)
Admission: EM | Admit: 2021-03-07 | Discharge: 2021-03-07 | Disposition: A | Discharge status: Home / Self Care | Payer: No Typology Code available for payment source | Attending: Emergency Medicine | Admitting: Emergency Medicine

## 2021-03-07 ENCOUNTER — Encounter (HOSPITAL_COMMUNITY): Payer: Self-pay

## 2021-03-07 ENCOUNTER — Other Ambulatory Visit: Payer: Self-pay

## 2021-03-07 DIAGNOSIS — R509 Fever, unspecified: Secondary | ICD-10-CM | POA: Diagnosis not present

## 2021-03-07 DIAGNOSIS — R112 Nausea with vomiting, unspecified: Secondary | ICD-10-CM | POA: Diagnosis present

## 2021-03-07 DIAGNOSIS — E860 Dehydration: Secondary | ICD-10-CM | POA: Insufficient documentation

## 2021-03-07 DIAGNOSIS — K852 Alcohol induced acute pancreatitis without necrosis or infection: Secondary | ICD-10-CM | POA: Diagnosis not present

## 2021-03-07 DIAGNOSIS — Z20822 Contact with and (suspected) exposure to covid-19: Secondary | ICD-10-CM | POA: Diagnosis not present

## 2021-03-07 LAB — URINALYSIS, ROUTINE W REFLEX MICROSCOPIC
Bilirubin Urine: NEGATIVE
Glucose, UA: NEGATIVE mg/dL
Ketones, ur: 5 mg/dL — AB
Leukocytes,Ua: NEGATIVE
Nitrite: NEGATIVE
Protein, ur: 100 mg/dL — AB
Specific Gravity, Urine: 1.028 (ref 1.005–1.030)
pH: 6 (ref 5.0–8.0)

## 2021-03-07 LAB — COMPREHENSIVE METABOLIC PANEL
ALT: 18 U/L (ref 0–44)
AST: 24 U/L (ref 15–41)
Albumin: 4.1 g/dL (ref 3.5–5.0)
Alkaline Phosphatase: 61 U/L (ref 38–126)
Anion gap: 11 (ref 5–15)
BUN: 11 mg/dL (ref 6–20)
CO2: 23 mmol/L (ref 22–32)
Calcium: 9.4 mg/dL (ref 8.9–10.3)
Chloride: 102 mmol/L (ref 98–111)
Creatinine, Ser: 1.09 mg/dL — ABNORMAL HIGH (ref 0.44–1.00)
GFR, Estimated: >60 mL/min (ref >60)
Glucose, Bld: 162 mg/dL — ABNORMAL HIGH (ref 70–99)
Potassium: 3.3 mmol/L — ABNORMAL LOW (ref 3.5–5.1)
Sodium: 136 mmol/L (ref 135–145)
Total Bilirubin: 0.9 mg/dL (ref 0.3–1.2)
Total Protein: 7.9 g/dL (ref 6.5–8.1)

## 2021-03-07 LAB — CBC WITH DIFFERENTIAL/PLATELET
Abs Immature Granulocytes: 0.03 10*3/uL (ref 0.00–0.07)
Basophils Absolute: 0.1 10*3/uL (ref 0.0–0.1)
Basophils Relative: 1 %
Eosinophils Absolute: 0.2 10*3/uL (ref 0.0–0.5)
Eosinophils Relative: 2 %
HCT: 49 % — ABNORMAL HIGH (ref 36.0–46.0)
Hemoglobin: 16.2 g/dL — ABNORMAL HIGH (ref 12.0–15.0)
Immature Granulocytes: 0 %
Lymphocytes Relative: 20 %
Lymphs Abs: 1.7 10*3/uL (ref 0.7–4.0)
MCH: 29.6 pg (ref 26.0–34.0)
MCHC: 33.1 g/dL (ref 30.0–36.0)
MCV: 89.6 fL (ref 80.0–100.0)
Monocytes Absolute: 1.3 10*3/uL — ABNORMAL HIGH (ref 0.1–1.0)
Monocytes Relative: 15 %
Neutro Abs: 5.5 10*3/uL (ref 1.7–7.7)
Neutrophils Relative %: 62 %
Platelets: 318 10*3/uL (ref 150–400)
RBC: 5.47 MIL/uL — ABNORMAL HIGH (ref 3.87–5.11)
RDW: 14.3 % (ref 11.5–15.5)
WBC: 8.8 10*3/uL (ref 4.0–10.5)
nRBC: 0 % (ref 0.0–0.2)

## 2021-03-07 LAB — I-STAT BETA HCG BLOOD, ED (MC, WL, AP ONLY): I-stat hCG, quantitative: <5 m[IU]/mL (ref <5)

## 2021-03-07 LAB — RESP PANEL BY RT-PCR (FLU A&B, COVID) ARPGX2
Influenza A by PCR: NEGATIVE
Influenza B by PCR: NEGATIVE
SARS Coronavirus 2 by RT PCR: NEGATIVE

## 2021-03-07 LAB — LIPASE, BLOOD: Lipase: 200 U/L — ABNORMAL HIGH (ref 11–51)

## 2021-03-07 MED ORDER — OXYCODONE-ACETAMINOPHEN 5-325 MG PO TABS: 1.0000 | ORAL_TABLET | Freq: Four times a day (QID) | ORAL | 0 refills | Status: DC | PRN

## 2021-03-07 MED ORDER — SODIUM CHLORIDE 0.9 % IV BOLUS
1000.0000 mL | Freq: Once | INTRAVENOUS | Status: AC
  Administered 2021-03-07: 17:00:00 1000 mL via INTRAVENOUS

## 2021-03-07 MED ORDER — ONDANSETRON 4 MG PO TBDP: 4.0000 mg | ORAL_TABLET | Freq: Three times a day (TID) | ORAL | 0 refills | Status: DC | PRN

## 2021-03-07 MED ORDER — ONDANSETRON HCL 4 MG/2ML IJ SOLN
4.0000 mg | Freq: Once | INTRAMUSCULAR | Status: AC
  Administered 2021-03-07: 17:00:00 4 mg via INTRAVENOUS
  Filled 2021-03-07: qty 2

## 2021-03-07 NOTE — ED Notes (Signed)
Patient discharge instructions reviewed with the patient. The patient verbalized understanding of instructions. Patient discharged. 

## 2021-03-07 NOTE — ED Provider Notes (Signed)
Emergency Medicine Provider Triage Evaluation Note  Barbara Yang , a 40 y.o. female  was evaluated in triage.  Pt complains of nausea and vomiting, fever and chills.  Symptoms started 3 days ago after eating mellow mushroom.  She had diarrhea at onset, this has resolved.  She states she still has nausea and vomiting after drinking fluids.  She is concerned about dehydration.  She has had fever and chills.  Nasal congestion but no other URI or flu symptoms.  No abdominal pain.  No irritative UTI symptoms.  History of hernia surgery.  Review of Systems  Positive: Nausea and vomiting Negative: Dysuria  Physical Exam  BP (!) 127/116 (BP Location: Right Arm)    Pulse 96    Temp 99.9 F (37.7 C) (Oral)    Resp 18    SpO2 100%  Gen:   Awake, no distress   Resp:  Normal effort  MSK:   Moves extremities without difficulty  Other:  Abdomen is soft and nontender, tachycardia  Medical Decision Making  Medically screening exam initiated at 12:53 PM.  Appropriate orders placed.  Barbara Yang was informed that the remainder of the evaluation will be completed by another provider, this initial triage assessment does not replace that evaluation, and the importance of remaining in the ED until their evaluation is complete.     Barbara Crigler, PA-C 03/07/21 1254    Barbara Munch, MD 03/07/21 [phone]

## 2021-03-07 NOTE — ED Provider Notes (Signed)
[facility] EMERGENCY DEPARTMENT Provider Note   CSN: [id number] Arrival date & time: 03/07/21  1247     History Chief Complaint  Patient presents with   Emesis    Barbara Yang is a 40 y.o. female who presents to the ED today with complaint of gradual onset, constant, nausea and nonbloody nonbilious emesis 3 days.  Patient reports she has issues with constipation quite frequently.  She went to eat at [facility] and shortly after eating began having nausea and vomiting as well as diarrhea however states the diarrhea has since resolved.  She has been unable to tolerate fluids at home and feels like she is dehydrated.  She does report early in her illness she was having some abdominal cramping that preceded having to have diarrhea however since the diarrhea is gone she is no longer having abdominal cramping.  He does report subjective fevers however has not checked her temperature at home.  She denies any recent sick contacts.  Her family 8 mm from as well and are not symptomatic.  Surgical history of hernia surgery.  Denies any urinary complaints.   The history is provided by the patient and medical records.      Past Medical History:  Diagnosis Date   Cough    non productive   Heartburn during pregnancy    takes Tums daily   Medical history non-contributory     Patient Active Problem List   Diagnosis Date Noted   Active labor 09/26/2014   NVD (normal vaginal delivery) 09/26/2014   Evaluate anatomy not seen on prior sonogram    Obesity affecting pregnancy, antepartum    Multiple marker screen positive for Down syndrome    AMA (advanced maternal age) multigravida 35+    [redacted] weeks gestation of pregnancy    Encounter for fetal anatomic survey    Abnormal quad screen    Advanced maternal age in multigravida    Pregnancy 04/23/2014   Lisfranc dislocation 04/22/2014    Past Surgical History:  Procedure Laterality Date   HARDWARE REMOVAL Left 09/09/2014    Procedure: HARDWARE REMOVAL LEFT FOOT ;  Surgeon: Myrene Galas, MD;  Location: [facility] OR;  Service: Orthopedics;  Laterality: Left;   ORIF ANKLE FRACTURE Left 04/22/2014   Procedure: OPEN REDUCTION INTERNAL FIXATION (ORIF) LEFT LISFRANC FRACTURE;  Surgeon: Budd Palmer, MD;  Location: [facility] OR;  Service: Orthopedics;  Laterality: Left;   UMBILICAL HERNIA REPAIR       OB History     Gravida  7   Para  2   Term  2   Preterm  0   AB  4   Living  1      SAB  4   IAB  0   Ectopic  0   Multiple  0   Live Births  1           Family History  Problem Relation Age of Onset   Breast cancer Mother    Diabetes Mother    Kidney disease Mother    Diabetes Maternal Grandmother    Diabetes Maternal Aunt     Social History   Tobacco Use   Smoking status: Never   Smokeless tobacco: Never  Vaping Use   Vaping Use: Never used  Substance Use Topics   Alcohol use: Yes    Comment: 2 per day   Drug use: No    Home Medications Prior to Admission medications   Medication Sig Start Date End  Date Taking? Authorizing Provider  ondansetron (ZOFRAN-ODT) 4 MG disintegrating tablet Take 1 tablet (4 mg total) by mouth every 8 (eight) hours as needed for nausea or vomiting. 03/07/21  Yes Alroy Bailiff, Laterria [place]ota, PA-C  oxyCODONE-acetaminophen (PERCOCET/ROXICET) 5-325 MG tablet Take 1 tablet by mouth every 6 (six) hours as needed for severe pain. 03/07/21  Yes Azure Budnick, PA-C  ACZONE 7.5 % GEL Apply topically every morning. 02/28/21   [provider]  fluconazole (DIFLUCAN) 150 MG tablet Take 1 tablet (150 mg total) by mouth daily. Take second dose 72 hours later if symptoms still persists. Patient not taking: Reported on 08/01/2020 01/07/20   Ok Edwards, PA-C  hydrOXYzine (ATARAX) 10 MG tablet Take 10 mg by mouth at bedtime. 02/28/21   [provider]  metroNIDAZOLE (FLAGYL) 500 MG tablet Take 1 tablet (500 mg total) by mouth 2 (two) times daily. 08/03/20   Chase Picket, MD  sertraline (ZOLOFT) 100 MG tablet Take 100 mg by mouth daily. 02/28/21   [provider]  traZODone (DESYREL) 50 MG tablet Take 50 mg by mouth at bedtime. 02/28/21   [provider]    Allergies    Patient has no known allergies.  Review of Systems   Review of Systems  Constitutional:  Positive for chills and fever (subjective).  Gastrointestinal:  Positive for diarrhea (resolved), nausea and vomiting. Negative for abdominal pain.  Genitourinary:  Negative for dysuria, flank pain, frequency, menstrual problem and vaginal discharge.  All other systems reviewed and are negative.  Physical Exam Updated Vital Signs BP 134/88    Pulse 67    Temp 99.9 F (37.7 C) (Oral)    Resp 18    LMP 02/18/2021 (Approximate)    SpO2 99%   Physical Exam Vitals and nursing note reviewed.  Constitutional:      Appearance: She is not ill-appearing or diaphoretic.  HENT:     Head: Normocephalic and atraumatic.     Mouth/Throat:     Mouth: Mucous membranes are dry.  Eyes:     Conjunctiva/sclera: Conjunctivae normal.  Cardiovascular:     Rate and Rhythm: Normal rate and regular rhythm.     Pulses: Normal pulses.  Pulmonary:     Effort: Pulmonary effort is normal.     Breath sounds: Normal breath sounds. No wheezing, rhonchi or rales.  Abdominal:     Palpations: Abdomen is soft.     Tenderness: There is no abdominal tenderness. There is no right CVA tenderness, left CVA tenderness, guarding or rebound.  Musculoskeletal:     Cervical back: Neck supple.  Skin:    General: Skin is warm and dry.  Neurological:     Mental Status: She is alert.    ED Results / Procedures / Treatments   Labs (all labs ordered are listed, but only abnormal results are displayed) Labs Reviewed  CBC WITH DIFFERENTIAL/PLATELET - Abnormal; Notable for the following components:      Result Value   RBC 5.47 (*)    Hemoglobin 16.2 (*)    HCT 49.0 (*)    Monocytes Absolute 1.3 (*)    All other  components within normal limits  COMPREHENSIVE METABOLIC PANEL - Abnormal; Notable for the following components:   Potassium 3.3 (*)    Glucose, Bld 162 (*)    Creatinine, Ser 1.09 (*)    All other components within normal limits  URINALYSIS, ROUTINE W REFLEX MICROSCOPIC - Abnormal; Notable for the following components:   Color, Urine AMBER (*)  APPearance CLOUDY (*)    Hgb urine dipstick SMALL (*)    Ketones, ur 5 (*)    Protein, ur 100 (*)    Bacteria, UA RARE (*)    All other components within normal limits  LIPASE, BLOOD - Abnormal; Notable for the following components:   Lipase 200 (*)    All other components within normal limits  RESP PANEL BY RT-PCR (FLU A&B, COVID) ARPGX2  I-STAT BETA HCG BLOOD, ED ([facility], [facility], [facility] ONLY)    EKG None  Radiology No results found.  Procedures Procedures   Medications Ordered in ED Medications  ondansetron (ZOFRAN) injection 4 mg (4 mg Intravenous Given 03/07/21 1641)  sodium chloride 0.9 % bolus 1,000 mL (1,000 mLs Intravenous New Bag/Given 03/07/21 1643)    ED Course  I have reviewed the triage vital signs and the nursing notes.  Pertinent labs & imaging results that were available during my care of the patient were reviewed by me and considered in my medical decision making (see chart for details).  Clinical Course as of 03/07/21 1826  Tue Mar 07, 2021  1803 Lipase(!): 200 [MV]    Clinical Course User Index [MV] Eustaquio Maize, [place]   MDM Rules/Calculators/A&P                          40 year old female who presents to the ED today with complaint of nausea, vomiting, diarrhea for the past 3 days without abdominal pain.  On arrival to the ED patient's temperature slightly of elevated 99.9.  Remainder of vitals are unremarkable.  She was medically screened and work-up started including CBC, CMP, hCG and urine.  CBC has returned without leukocytosis.  Red blood cell count, hemoglobin, hematocrit are all elevated suggestive of  dehydration. CMP with a slight increase in creatinine of 1.09 however BUN of 11.  Potassium slightly decreased at 3.3. hCG negative Urinalysis with small hemoglobin on dipstick, 11-20 red blood cells, 6-10 white blood cells, rare bacteria, a 21-50 squamous epithelial cells.  Patient denies urinary symptoms at this time.  She has no abdominal tenderness palpation on exam and no CVA tenderness palpation today.  I do not feel her urinalysis is consistent with UTI at this time.  Does appear she has had hemoglobin in her urine in the past however is not currently on her menstrual cycle.  She will need to follow-up with PCP for same.  On my exam she is resting comfortably.  She is noted to have dry mucous membranes however abdomen is soft and nontender.  We will plan for fluids and antiemetics.  We will add on lipase at this time and plan to fluid challenge prior to discharge  Lipase elevated at 200. Pt does report drinking about 1/2 bottle of tequila daily recently due to her mother passing away.  She does report improvement after IV fluids and antiemetics.  She has been able to tolerate PO. Will discharge home at this time with symptomatic treatment. Pt instructed to drink plenty of fluids to stay hydrated and to follow up with PCP for further eval. She is in agreement with plan and stable for discharge home.   This note was prepared using Dragon voice recognition software and may include unintentional dictation errors due to the inherent limitations of voice recognition software.     Final Clinical Impression(s) / ED Diagnoses Final diagnoses:  Alcohol-induced acute pancreatitis without infection or necrosis    Rx / DC Orders ED  Discharge Orders          Ordered    oxyCODONE-acetaminophen (PERCOCET/ROXICET) 5-325 MG tablet  Every 6 hours PRN        03/07/21 1815    ondansetron (ZOFRAN-ODT) 4 MG disintegrating tablet  Every 8 hours PRN        03/07/21 1815             Discharge  Instructions      Please pick up medications and take as prescribed for nausea. I have also prescribed a short course of pain medication to take as needed if you develop pain.   It is important to drink plenty of fluids to stay hydrated as fluid resuscitation is the most helpful in treating pancreatitis.   Please also consider cutting back on the amount of alcohol you drink. It is not recommended that you stop drinking cold [place] as you can land yourself into alcohol withdrawals.   Follow up with your PCP for further evaluation. If you do not have one there is a 1800 number on this discharge paperwork that will help you find one that accepts your insurance.   Return to the ED IMMEDIATELY for any new/worsening symptoms including new/worsening pain, inability to tolerate fluids at home despite nausea medication, or any other new/concerning symptoms       Eustaquio Maize, PA-C 03/07/21 Hitchcock, [place], MD 03/07/21 [id number]

## 2021-03-07 NOTE — ED Triage Notes (Signed)
Pt reports constipation for the past week up until Saturday, she ate some pizza and since then she has had diarrhea and emesis. Denies abd pain.

## 2021-03-07 NOTE — Discharge Instructions (Addendum)
Please pick up medications and take as prescribed for nausea. I have also prescribed a short course of pain medication to take as needed if you develop pain.   It is important to drink plenty of fluids to stay hydrated as fluid resuscitation is the most helpful in treating pancreatitis.   Please also consider cutting back on the amount of alcohol you drink. It is not recommended that you stop drinking cold [place] as you can land yourself into alcohol withdrawals.   Follow up with your PCP for further evaluation. If you do not have one there is a 1800 number on this discharge paperwork that will help you find one that accepts your insurance.   Return to the ED IMMEDIATELY for any new/worsening symptoms including new/worsening pain, inability to tolerate fluids at home despite nausea medication, or any other new/concerning symptoms

## 2021-06-07 DIAGNOSIS — Z7721 Contact with and (suspected) exposure to potentially hazardous body fluids: Secondary | ICD-10-CM | POA: Insufficient documentation

## 2021-11-22 ENCOUNTER — Ambulatory Visit
Admission: EM | Admit: 2021-11-22 | Discharge: 2021-11-22 | Disposition: A | Discharge status: Home / Self Care | Payer: No Typology Code available for payment source | Attending: Physician Assistant | Admitting: Physician Assistant

## 2021-11-22 DIAGNOSIS — Z1152 Encounter for screening for COVID-19: Secondary | ICD-10-CM

## 2021-11-22 DIAGNOSIS — J069 Acute upper respiratory infection, unspecified: Secondary | ICD-10-CM

## 2021-11-22 NOTE — ED Triage Notes (Signed)
Pt c/o nasal drainage, cough, neck aches, chills, onset ~ Monday

## 2021-11-22 NOTE — ED Provider Notes (Signed)
[name]    CSN: [id number] Arrival date & time: 11/22/21  1816      History   Chief Complaint Chief Complaint  Patient presents with   Cough    HPI Barbara Yang is a 41 y.o. female.   Patient here today for evaluation of nasal congestion, cough, body aches and chills that started 2 days ago.  She reports most of her body aches in her neck area.  She feels has had some fever but has not measured her temperature.  Her daughter and boyfriend are recently sick as well.  She denies any nausea, vomiting or diarrhea.  She has tried over-the-counter medication without significant relief.  The history is provided by the patient.    Past Medical History:  Diagnosis Date   Cough    non productive   Heartburn during pregnancy    takes Tums daily   Medical history non-contributory     Patient Active Problem List   Diagnosis Date Noted   Active labor 09/26/2014   NVD (normal vaginal delivery) 09/26/2014   Evaluate anatomy not seen on prior sonogram    Obesity affecting pregnancy, antepartum    Multiple marker screen positive for Down syndrome    AMA (advanced maternal age) multigravida 35+    [redacted] weeks gestation of pregnancy    Encounter for fetal anatomic survey    Abnormal quad screen    Advanced maternal age in multigravida    Pregnancy 04/23/2014   Lisfranc dislocation 04/22/2014    Past Surgical History:  Procedure Laterality Date   HARDWARE REMOVAL Left 09/09/2014   Procedure: HARDWARE REMOVAL LEFT FOOT ;  Surgeon: Myrene Galas, MD;  Location: [facility] OR;  Service: Orthopedics;  Laterality: Left;   ORIF ANKLE FRACTURE Left 04/22/2014   Procedure: OPEN REDUCTION INTERNAL FIXATION (ORIF) LEFT LISFRANC FRACTURE;  Surgeon: Budd Palmer, MD;  Location: [facility] OR;  Service: Orthopedics;  Laterality: Left;   UMBILICAL HERNIA REPAIR      OB History     Gravida  7   Para  2   Term  2   Preterm  0   AB  4   Living  1      SAB  4   IAB  0   Ectopic   0   Multiple  0   Live Births  1            Home Medications    Prior to Admission medications   Medication Sig Start Date End Date Taking? Authorizing Provider  ACZONE 7.5 % GEL Apply topically every morning. 02/28/21   [provider]  fluconazole (DIFLUCAN) 150 MG tablet Take 1 tablet (150 mg total) by mouth daily. Take second dose 72 hours later if symptoms still persists. Patient not taking: Reported on 08/01/2020 01/07/20   Belinda Fisher, PA-C  hydrOXYzine (ATARAX) 10 MG tablet Take 10 mg by mouth at bedtime. 02/28/21   [provider]  metroNIDAZOLE (FLAGYL) 500 MG tablet Take 1 tablet (500 mg total) by mouth 2 (two) times daily. 08/03/20   Lamptey, Britta Mccreedy, MD  ondansetron (ZOFRAN-ODT) 4 MG disintegrating tablet Take 1 tablet (4 mg total) by mouth every 8 (eight) hours as needed for nausea or vomiting. 03/07/21   Tanda Rockers, PA-C  oxyCODONE-acetaminophen (PERCOCET/ROXICET) 5-325 MG tablet Take 1 tablet by mouth every 6 (six) hours as needed for severe pain. 03/07/21   Tanda Rockers, PA-C  sertraline (ZOLOFT) 100 MG tablet Take 100 mg by  mouth daily. 02/28/21   [provider]  traZODone (DESYREL) 50 MG tablet Take 50 mg by mouth at bedtime. 02/28/21   [provider]    Family History Family History  Problem Relation Age of Onset   Breast cancer Mother    Diabetes Mother    Kidney disease Mother    Diabetes Maternal Grandmother    Diabetes Maternal Aunt     Social History Social History   Tobacco Use   Smoking status: Never   Smokeless tobacco: Never  Vaping Use   Vaping Use: Never used  Substance Use Topics   Alcohol use: Yes    Comment: 2 per day   Drug use: No     Allergies   Patient has no known allergies.   Review of Systems Review of Systems  Constitutional:  Positive for chills and fever.  HENT:  Positive for congestion and sore throat. Negative for ear pain.   Eyes:  Negative for discharge and redness.   Respiratory:  Positive for cough. Negative for shortness of breath and wheezing.   Gastrointestinal:  Negative for abdominal pain, diarrhea, nausea and vomiting.  Musculoskeletal:  Positive for myalgias.     Physical Exam Triage Vital Signs ED Triage Vitals  Enc Vitals Group     BP      Pulse      Resp      Temp      Temp src      SpO2      Weight      Height      Head Circumference      Peak Flow      Pain Score      Pain Loc      Pain Edu?      Excl. in GC?    No data found.  Updated Vital Signs BP 117/84 (BP Location: Left Arm)   Pulse 89   Temp 99.3 F (37.4 C) (Oral)   Resp 16   SpO2 94%   Physical Exam Vitals and nursing note reviewed.  Constitutional:      General: She is not in acute distress.    Appearance: Normal appearance. She is not ill-appearing.  HENT:     Head: Normocephalic and atraumatic.     Nose: Congestion present.     Mouth/Throat:     Mouth: Mucous membranes are moist.     Pharynx: No oropharyngeal exudate or posterior oropharyngeal erythema.  Eyes:     Conjunctiva/sclera: Conjunctivae normal.  Cardiovascular:     Rate and Rhythm: Normal rate and regular rhythm.     Heart sounds: Normal heart sounds. No murmur heard. Pulmonary:     Effort: Pulmonary effort is normal. No respiratory distress.     Breath sounds: Normal breath sounds. No wheezing, rhonchi or rales.  Skin:    General: Skin is warm and dry.  Neurological:     Mental Status: She is alert.  Psychiatric:        Mood and Affect: Mood normal.        Thought Content: Thought content normal.      UC Treatments / Results  Labs (all labs ordered are listed, but only abnormal results are displayed) Labs Reviewed  RESP PANEL BY RT-PCR (FLU A&B, COVID) ARPGX2    EKG   Radiology No results found.  Procedures Procedures (including critical care time)  Medications Ordered in UC Medications - No data to display  Initial Impression / Assessment and Plan /  UC  Course  I have reviewed the triage vital signs and the nursing notes.  Pertinent labs & imaging results that were available during my care of the patient were reviewed by me and considered in my medical decision making (see chart for details).    Suspect viral etiology of symptoms.  Will order screening for COVID and flu.  Will await results further recommendation but encouraged symptomatic treatment, increase fluids and rest in the meantime.  Recommend follow-up with any further concerns.  Final Clinical Impressions(s) / UC Diagnoses   Final diagnoses:  Viral upper respiratory tract infection  Encounter for screening for COVID-19   Discharge Instructions   None    ED Prescriptions   None    PDMP not reviewed this encounter.   Francene Finders, PA-C 11/22/21 1946

## 2021-11-23 LAB — RESP PANEL BY RT-PCR (FLU A&B, COVID) ARPGX2
Influenza A by PCR: NEGATIVE
Influenza B by PCR: NEGATIVE
SARS Coronavirus 2 by RT PCR: POSITIVE — AB

## 2021-12-26 ENCOUNTER — Encounter: Payer: Self-pay | Admitting: Physician Assistant

## 2021-12-26 ENCOUNTER — Ambulatory Visit
Admission: EM | Admit: 2021-12-26 | Discharge: 2021-12-26 | Disposition: A | Discharge status: Home / Self Care | Payer: Self-pay | Attending: Physician Assistant | Admitting: Physician Assistant

## 2021-12-26 DIAGNOSIS — L03031 Cellulitis of right toe: Secondary | ICD-10-CM

## 2021-12-26 MED ORDER — DOXYCYCLINE HYCLATE 100 MG PO CAPS: 100.0000 mg | ORAL_CAPSULE | Freq: Two times a day (BID) | ORAL | 0 refills | Status: DC

## 2021-12-26 NOTE — ED Provider Notes (Signed)
[name]    CSN: [id number] Arrival date & time: 12/26/21  1810      History   Chief Complaint Chief Complaint  Patient presents with   toe nail probelm     HPI Barbara Yang is a 41 y.o. female.   Here today for evaluation of swelling and drainage from her right big toe that started a few days ago.  She reports that she was clipping her cuticle and states symptoms started after this.  She has had some drainage from her cuticle area.  She has tried using alcohol which has seemed to be somewhat helpful.  She has not had any fever or nausea or vomiting.  The history is provided by the patient.    Past Medical History:  Diagnosis Date   Cough    non productive   Heartburn during pregnancy    takes Tums daily   Medical history non-contributory     Patient Active Problem List   Diagnosis Date Noted   Active labor 09/26/2014   NVD (normal vaginal delivery) 09/26/2014   Evaluate anatomy not seen on prior sonogram    Obesity affecting pregnancy, antepartum    Multiple marker screen positive for Down syndrome    AMA (advanced maternal age) multigravida 35+    [redacted] weeks gestation of pregnancy    Encounter for fetal anatomic survey    Abnormal quad screen    Advanced maternal age in multigravida    Pregnancy 04/23/2014   Lisfranc dislocation 04/22/2014    Past Surgical History:  Procedure Laterality Date   HARDWARE REMOVAL Left 09/09/2014   Procedure: HARDWARE REMOVAL LEFT FOOT ;  Surgeon: Myrene Galas, MD;  Location: [facility];  Service: Orthopedics;  Laterality: Left;   ORIF ANKLE FRACTURE Left 04/22/2014   Procedure: OPEN REDUCTION INTERNAL FIXATION (ORIF) LEFT LISFRANC FRACTURE;  Surgeon: Budd Palmer, MD;  Location: [facility] OR;  Service: Orthopedics;  Laterality: Left;   UMBILICAL HERNIA REPAIR      OB History     Gravida  7   Para  2   Term  2   Preterm  0   AB  4   Living  1      SAB  4   IAB  0   Ectopic  0   Multiple  0   Live  Births  1            Home Medications    Prior to Admission medications   Medication Sig Start Date End Date Taking? Authorizing Provider  doxycycline (VIBRAMYCIN) 100 MG capsule Take 1 capsule (100 mg total) by mouth 2 (two) times daily. 12/26/21  Yes Tomi Bamberger, PA-C  ACZONE 7.5 % GEL Apply topically every morning. 02/28/21   [provider]  fluconazole (DIFLUCAN) 150 MG tablet Take 1 tablet (150 mg total) by mouth daily. Take second dose 72 hours later if symptoms still persists. Patient not taking: Reported on 08/01/2020 01/07/20   Belinda Fisher, PA-C  hydrOXYzine (ATARAX) 10 MG tablet Take 10 mg by mouth at bedtime. 02/28/21   [provider]  metroNIDAZOLE (FLAGYL) 500 MG tablet Take 1 tablet (500 mg total) by mouth 2 (two) times daily. 08/03/20   Lamptey, Britta Mccreedy, MD  ondansetron (ZOFRAN-ODT) 4 MG disintegrating tablet Take 1 tablet (4 mg total) by mouth every 8 (eight) hours as needed for nausea or vomiting. 03/07/21   Tanda Rockers, PA-C  oxyCODONE-acetaminophen (PERCOCET/ROXICET) 5-325 MG tablet Take 1 tablet by  mouth every 6 (six) hours as needed for severe pain. 03/07/21   Hyman Hopes, Margaux, PA-C  sertraline (ZOLOFT) 100 MG tablet Take 100 mg by mouth daily. 02/28/21   [provider]  traZODone (DESYREL) 50 MG tablet Take 50 mg by mouth at bedtime. 02/28/21   [provider]    Family History Family History  Problem Relation Age of Onset   Breast cancer Mother    Diabetes Mother    Kidney disease Mother    Diabetes Maternal Grandmother    Diabetes Maternal Aunt     Social History Social History   Tobacco Use   Smoking status: Never   Smokeless tobacco: Never  Vaping Use   Vaping Use: Never used  Substance Use Topics   Alcohol use: Yes    Comment: 2 per day   Drug use: No     Allergies   Patient has no known allergies.   Review of Systems Review of Systems  Constitutional:  Negative for chills and fever.  Eyes:   Negative for discharge and redness.  Respiratory:  Negative for shortness of breath.   Gastrointestinal:  Negative for nausea and vomiting.  Skin:  Positive for color change and wound.     Physical Exam Triage Vital Signs ED Triage Vitals [12/26/21 1935]  Enc Vitals Group     BP 128/88     Pulse Rate 98     Resp 18     Temp 98 F (36.7 C)     Temp src      SpO2 98 %     Weight      Height      Head Circumference      Peak Flow      Pain Score 4     Pain Loc      Pain Edu?      Excl. in GC?    No data found.  Updated Vital Signs BP 128/88   Pulse 98   Temp 98 F (36.7 C)   Resp 18   SpO2 98%      Physical Exam Vitals and nursing note reviewed.  Constitutional:      General: She is not in acute distress.    Appearance: Normal appearance. She is not ill-appearing.  HENT:     Head: Normocephalic and atraumatic.  Eyes:     Conjunctiva/sclera: Conjunctivae normal.  Cardiovascular:     Rate and Rhythm: Normal rate.  Pulmonary:     Effort: Pulmonary effort is normal.  Skin:    Comments: Mild swelling to medial right big toe with small wound with dried drainage noted to medial lower cuticle, no active drainage or bleeding  Neurological:     Mental Status: She is alert.  Psychiatric:        Mood and Affect: Mood normal.        Behavior: Behavior normal.        Thought Content: Thought content normal.      UC Treatments / Results  Labs (all labs ordered are listed, but only abnormal results are displayed) Labs Reviewed - No data to display  EKG   Radiology No results found.  Procedures Procedures (including critical care time)  Medications Ordered in UC Medications - No data to display  Initial Impression / Assessment and Plan / UC Course  I have reviewed the triage vital signs and the nursing notes.  Pertinent labs & imaging results that were available during my care of the patient were  reviewed by me and considered in my medical decision  making (see chart for details).     Suspect paronychia.  Will treat with doxycycline and recommended warm Epsom salt water soaks.  Encouraged follow-up with any persistent symptoms or further concerns.   Final Clinical Impressions(s) / UC Diagnoses   Final diagnoses:  Acute paronychia of toe of right foot   Discharge Instructions   None    ED Prescriptions     Medication Sig Dispense Auth. Provider   doxycycline (VIBRAMYCIN) 100 MG capsule Take 1 capsule (100 mg total) by mouth 2 (two) times daily. 20 capsule Francene Finders, PA-C      PDMP not reviewed this encounter.   Francene Finders, PA-C 12/26/21 1948

## 2021-12-26 NOTE — ED Triage Notes (Signed)
Pt presents to uc with co of drainage from right big toe. Pt reports she trimmed her toe nails Friday and pain swelling and drainage started on Sunday.

## 2022-01-03 ENCOUNTER — Telehealth: Payer: Self-pay | Admitting: Physician Assistant

## 2022-01-03 MED ORDER — FLUCONAZOLE 150 MG PO TABS: 150.0000 mg | ORAL_TABLET | Freq: Once | ORAL | 0 refills | Status: AC

## 2022-01-03 NOTE — Telephone Encounter (Signed)
Patient reports concerns with yeast infection after antibiotic therapy.  Diflucan prescribed.

## 2022-01-05 ENCOUNTER — Telehealth: Payer: Self-pay | Admitting: Physician Assistant

## 2022-01-05 MED ORDER — FLUCONAZOLE 150 MG PO TABS: 150.0000 mg | ORAL_TABLET | Freq: Once | ORAL | 0 refills | Status: AC

## 2022-01-05 NOTE — Telephone Encounter (Signed)
Patient called requesting additional diflucan dose- symptoms of yeast infection have not fully cleared. RX sent as requested.

## 2022-01-15 ENCOUNTER — Other Ambulatory Visit: Payer: Self-pay | Admitting: Family Medicine

## 2022-01-15 DIAGNOSIS — Z1231 Encounter for screening mammogram for malignant neoplasm of breast: Secondary | ICD-10-CM

## 2022-11-22 DIAGNOSIS — E669 Obesity, unspecified: Secondary | ICD-10-CM | POA: Insufficient documentation

## 2022-11-28 ENCOUNTER — Ambulatory Visit
Admission: EM | Admit: 2022-11-28 | Discharge: 2022-11-28 | Disposition: A | Discharge status: Home / Self Care | Payer: 59 | Attending: Internal Medicine | Admitting: Internal Medicine

## 2022-11-28 ENCOUNTER — Encounter: Payer: Self-pay | Admitting: *Deleted

## 2022-11-28 ENCOUNTER — Other Ambulatory Visit: Payer: Self-pay

## 2022-11-28 ENCOUNTER — Ambulatory Visit: Payer: 59

## 2022-11-28 DIAGNOSIS — M50323 Other cervical disc degeneration at C6-C7 level: Secondary | ICD-10-CM | POA: Diagnosis not present

## 2022-11-28 DIAGNOSIS — M542 Cervicalgia: Secondary | ICD-10-CM | POA: Diagnosis not present

## 2022-11-28 DIAGNOSIS — N898 Other specified noninflammatory disorders of vagina: Secondary | ICD-10-CM

## 2022-11-28 DIAGNOSIS — M50322 Other cervical disc degeneration at C5-C6 level: Secondary | ICD-10-CM | POA: Insufficient documentation

## 2022-11-28 DIAGNOSIS — W19XXXA Unspecified fall, initial encounter: Secondary | ICD-10-CM | POA: Diagnosis not present

## 2022-11-28 DIAGNOSIS — M25562 Pain in left knee: Secondary | ICD-10-CM | POA: Diagnosis present

## 2022-11-28 MED ORDER — KETOROLAC TROMETHAMINE 30 MG/ML IJ SOLN
30.0000 mg | Freq: Once | INTRAMUSCULAR | Status: AC
  Administered 2022-11-28: 30 mg via INTRAMUSCULAR

## 2022-11-28 NOTE — Discharge Instructions (Addendum)
X-rays were normal.  You are given a Toradol shot today in urgent care to help with pain and swelling.  Do not take any ibuprofen, Advil, Aleve, Excedrin for at least 24 hours following injection.  Apply ice and elevate extremity.  Knee brace applied today. Follow up with orthopedist if symptoms persist or worsen.

## 2022-11-28 NOTE — ED Provider Notes (Signed)
[name]    CSN: [id number] Arrival date & time: 11/28/22  4782      History   Chief Complaint Chief Complaint  Patient presents with   Knee Pain    HPI Barbara Yang is a 42 y.o. female.   Patient presents for further evaluation after an injury that occurred at the gym yesterday.  Patient reports that her knee twisted wrong and "came out".  She states this has occurred previously but it has been a long time.  She was previously evaluated by orthopedist and they told her that she needs to kick her leg out to pop it back into place.  She states that she did this, but when she did this she fell backwards hitting her neck on a table.  Denies loss of consciousness or head injury.  Denies headache, dizziness, blurred vision, nausea, vomiting.  She took Midol for pain with minimal improvement.  Is concerned given amount of swelling noted to the medial portion of the left knee. Denies numbness or tingling.   Patient is also requesting testing for BV and yeast.  She states that she does have vaginal discharge but it could be related to ovulation.  She has no concern or exposure to STD.  Last menstrual cycle was approximately 2 weeks ago.   Knee Pain   Past Medical History:  Diagnosis Date   Cough    non productive   Heartburn during pregnancy    takes Tums daily   Medical history non-contributory     Patient Active Problem List   Diagnosis Date Noted   Active labor 09/26/2014   NVD (normal vaginal delivery) 09/26/2014   Evaluate anatomy not seen on prior sonogram    Obesity affecting pregnancy, antepartum    Multiple marker screen positive for Down syndrome    AMA (advanced maternal age) multigravida 35+    [redacted] weeks gestation of pregnancy    Encounter for fetal anatomic survey    Abnormal quad screen    Advanced maternal age in multigravida    Pregnancy 04/23/2014   Lisfranc dislocation 04/22/2014    Past Surgical History:  Procedure Laterality Date    HARDWARE REMOVAL Left 09/09/2014   Procedure: HARDWARE REMOVAL LEFT FOOT ;  Surgeon: Myrene Galas, MD;  Location: [facility] OR;  Service: Orthopedics;  Laterality: Left;   ORIF ANKLE FRACTURE Left 04/22/2014   Procedure: OPEN REDUCTION INTERNAL FIXATION (ORIF) LEFT LISFRANC FRACTURE;  Surgeon: Budd Palmer, MD;  Location: [facility] OR;  Service: Orthopedics;  Laterality: Left;   UMBILICAL HERNIA REPAIR      OB History     Gravida  7   Para  2   Term  2   Preterm  0   AB  4   Living  1      SAB  4   IAB  0   Ectopic  0   Multiple  0   Live Births  1            Home Medications    Prior to Admission medications   Medication Sig Start Date End Date Taking? Authorizing Provider  ACZONE 7.5 % GEL Apply topically every morning. 02/28/21   [provider]  doxycycline (VIBRAMYCIN) 100 MG capsule Take 1 capsule (100 mg total) by mouth 2 (two) times daily. 12/26/21   Tomi Bamberger, PA-C  hydrOXYzine (ATARAX) 10 MG tablet Take 10 mg by mouth at bedtime. 02/28/21   [provider]  metroNIDAZOLE (FLAGYL) 500  MG tablet Take 1 tablet (500 mg total) by mouth 2 (two) times daily. 08/03/20   Lamptey, Britta Mccreedy, MD  ondansetron (ZOFRAN-ODT) 4 MG disintegrating tablet Take 1 tablet (4 mg total) by mouth every 8 (eight) hours as needed for nausea or vomiting. 03/07/21   Tanda Rockers, PA-C  oxyCODONE-acetaminophen (PERCOCET/ROXICET) 5-325 MG tablet Take 1 tablet by mouth every 6 (six) hours as needed for severe pain. 03/07/21   Hyman Hopes, Margaux, PA-C  sertraline (ZOLOFT) 100 MG tablet Take 100 mg by mouth daily. 02/28/21   [provider]  traZODone (DESYREL) 50 MG tablet Take 50 mg by mouth at bedtime. 02/28/21   [provider]    Family History Family History  Problem Relation Age of Onset   Breast cancer Mother    Diabetes Mother    Kidney disease Mother    Diabetes Maternal Grandmother    Diabetes Maternal Aunt     Social History Social  History   Tobacco Use   Smoking status: Never   Smokeless tobacco: Never  Vaping Use   Vaping status: Never Used  Substance Use Topics   Alcohol use: Yes    Comment: 2 per day   Drug use: No     Allergies   Patient has no known allergies.   Review of Systems Review of Systems Per HPI  Physical Exam Triage Vital Signs ED Triage Vitals  Encounter Vitals Group     BP 11/28/22 0954 107/70     Systolic BP Percentile --      Diastolic BP Percentile --      Pulse Rate 11/28/22 0954 66     Resp 11/28/22 0954 20     Temp 11/28/22 0954 98.2 F (36.8 C)     Temp Source 11/28/22 0954 Oral     SpO2 11/28/22 0954 95 %     Weight --      Height --      Head Circumference --      Peak Flow --      Pain Score 11/28/22 1001 8     Pain Loc --      Pain Education --      Exclude from Growth Chart --    No data found.  Updated Vital Signs BP 107/70 (BP Location: Left Arm)   Pulse 66   Temp 98.2 F (36.8 C) (Oral)   Resp 20   LMP 11/16/2022 (Approximate)   SpO2 95%   Visual Acuity Right Eye Distance:   Left Eye Distance:   Bilateral Distance:    Right Eye Near:   Left Eye Near:    Bilateral Near:     Physical Exam Constitutional:      General: She is not in acute distress.    Appearance: Normal appearance. She is not toxic-appearing or diaphoretic.  HENT:     Head: Normocephalic and atraumatic.  Eyes:     Extraocular Movements: Extraocular movements intact.     Conjunctiva/sclera: Conjunctivae normal.  Neck:     Comments: Mild tenderness to palpation throughout bilateral lateral neck muscles.  No significant tenderness to palpation directly overlying spinal area.  No crepitus or step-off noted.  No swelling, discoloration, lacerations, abrasions noted.  Full range of motion of neck present. Pulmonary:     Effort: Pulmonary effort is normal.  Genitourinary:    Comments: Deferred with shared decision making. Self swab performed.  Musculoskeletal:     Comments:  Tenderness to palpation with mild swelling present to medial  portion of left knee.  No abrasions or lacerations noted.  No warmth noted.  Full range of motion present with no crepitus.  Neurovascularly intact.  Patient can bear weight.  Neurological:     General: No focal deficit present.     Mental Status: She is alert and oriented to person, place, and time. Mental status is at baseline.     Cranial Nerves: Cranial nerves 2-12 are intact.     Sensory: Sensation is intact.     Motor: Motor function is intact.     Coordination: Coordination is intact.     Gait: Gait is intact.  Psychiatric:        Mood and Affect: Mood normal.        Behavior: Behavior normal.        Thought Content: Thought content normal.        Judgment: Judgment normal.      UC Treatments / Results  Labs (all labs ordered are listed, but only abnormal results are displayed) Labs Reviewed  CERVICOVAGINAL ANCILLARY ONLY    EKG   Radiology DG Knee Complete 4 Views Left  Result Date: 11/28/2022 CLINICAL DATA:  Fall yesterday due to knee giving way. EXAM: LEFT KNEE - COMPLETE 4 VIEW COMPARISON:  None Available. FINDINGS: No evidence of fracture, dislocation, or joint effusion. Tiny marginal patellar spurs at the patellofemoral compartment. IMPRESSION: No acute finding or degenerative joint space narrowing. Electronically Signed   By: Tiburcio Pea M.D.   On: 11/28/2022 11:29   DG Cervical Spine 2-3 Views  Result Date: 11/28/2022 CLINICAL DATA:  Fall EXAM: CERVICAL SPINE - 2 VIEW COMPARISON:  None Available. FINDINGS: Mild disc space narrowing with ventral endplate spurring at C3-4, C5-6, and C6-7. Mild facet spurring at C7-T1. No traumatic malalignment or visible fracture. No prevertebral thickening. IMPRESSION: No acute finding. Mild disc degeneration affecting levels listed above. Electronically Signed   By: Tiburcio Pea M.D.   On: 11/28/2022 11:28    Procedures Procedures (including critical care  time)  Medications Ordered in UC Medications  ketorolac (TORADOL) 30 MG/ML injection 30 mg (30 mg Intramuscular Given 11/28/22 1151)    Initial Impression / Assessment and Plan / UC Course  I have reviewed the triage vital signs and the nursing notes.  Pertinent labs & imaging results that were available during my care of the patient were reviewed by me and considered in my medical decision making (see chart for details).     1.  Fall, knee pain, neck pain  X-rays of the left knee and neck were negative for any acute bony abnormality.  Suspect muscular strain/injury.  Patient reports knee pain is an acute on chronic issue so encouraged follow-up with orthopedist at provided phone number. Clinical staff placed knee brace to help with support and stability.  Advised patient not to sleep in this.  Advised supportive care including ice application.  IM Toradol administered today to help alleviate pain and inflammation.  Advised no NSAIDs for least 24 hours following injection and safe over-the-counter pain relievers as needed after injection.  2.  Vaginal discharge  Cervicovaginal swab pending.  Given no confirmed exposure to STD or concern for this, will await results prior to treatment.  Patient declined STD testing.  Strict return precautions for all chief complaints today.  Patient verbalized understanding and was agreeable with plan. Final Clinical Impressions(s) / UC Diagnoses   Final diagnoses:  Acute pain of left knee  Neck pain  Vaginal discharge  Fall,  initial encounter     Discharge Instructions      X-rays were normal.  You are given a Toradol shot today in urgent care to help with pain and swelling.  Do not take any ibuprofen, Advil, Aleve, Excedrin for at least 24 hours following injection.  Apply ice and elevate extremity.  Knee brace applied today. Follow up with orthopedist if symptoms persist or worsen.      ED Prescriptions   None    PDMP not reviewed this  encounter.   Gustavus Bryant, [place] 11/28/22 1220

## 2022-11-28 NOTE — ED Triage Notes (Signed)
Pt reports she was at the gym yesterday and her "knee came out" when she turned and she fell. States her knee is swollen and has done this before. She works at [employer] and has to walk a lot and would like something to help with the swelling

## 2022-11-29 ENCOUNTER — Telehealth: Payer: Self-pay

## 2022-11-29 LAB — CERVICOVAGINAL ANCILLARY ONLY
Bacterial Vaginitis (gardnerella): POSITIVE — AB
Candida Glabrata: NEGATIVE
Candida Vaginitis: NEGATIVE
Comment: NEGATIVE
Comment: NEGATIVE
Comment: NEGATIVE

## 2022-11-29 MED ORDER — METRONIDAZOLE 500 MG PO TABS: 500.0000 mg | ORAL_TABLET | Freq: Two times a day (BID) | ORAL | 0 refills | Status: DC

## 2022-11-29 MED ORDER — FLUCONAZOLE 150 MG PO TABS: 150.0000 mg | ORAL_TABLET | Freq: Every day | ORAL | 0 refills | Status: AC

## 2023-03-20 ENCOUNTER — Ambulatory Visit
Admission: EM | Admit: 2023-03-20 | Discharge: 2023-03-20 | Disposition: A | Discharge status: Home / Self Care | Payer: 59 | Attending: Physician Assistant | Admitting: Physician Assistant

## 2023-03-20 DIAGNOSIS — N760 Acute vaginitis: Secondary | ICD-10-CM | POA: Insufficient documentation

## 2023-03-20 NOTE — ED Triage Notes (Signed)
 Patient presents with vaginal odor and discharge x day 4. No treatment used.

## 2023-03-21 LAB — CERVICOVAGINAL ANCILLARY ONLY
Bacterial Vaginitis (gardnerella): POSITIVE — AB
Candida Glabrata: NEGATIVE
Candida Vaginitis: NEGATIVE
Chlamydia: NEGATIVE
Comment: NEGATIVE
Comment: NEGATIVE
Comment: NEGATIVE
Comment: NEGATIVE
Comment: NEGATIVE
Comment: NORMAL
Neisseria Gonorrhea: NEGATIVE
Trichomonas: NEGATIVE

## 2023-03-22 ENCOUNTER — Telehealth: Payer: Self-pay

## 2023-03-22 ENCOUNTER — Telehealth: Payer: Self-pay | Admitting: Emergency Medicine

## 2023-03-22 DIAGNOSIS — B9689 Other specified bacterial agents as the cause of diseases classified elsewhere: Secondary | ICD-10-CM

## 2023-03-22 MED ORDER — FLUCONAZOLE 150 MG PO TABS: 150.0000 mg | ORAL_TABLET | Freq: Every day | ORAL | 0 refills | Status: AC

## 2023-03-22 MED ORDER — METRONIDAZOLE 500 MG PO TABS: 500.0000 mg | ORAL_TABLET | Freq: Two times a day (BID) | ORAL | 0 refills | Status: AC

## 2023-03-22 NOTE — Telephone Encounter (Signed)
 Made aware of issue with Metronidazole prescription not being sent, please disregard note on results.  Patient treated at this time

## 2023-03-22 NOTE — Addendum Note (Signed)
 Addended by: Manual Meier B on: 03/22/2023 03:13 PM   Modules accepted: Orders

## 2023-03-22 NOTE — Telephone Encounter (Signed)
 Incoming call to patient access:  mrn [phone] J. Ciolino called inquiring about her rx since she has a positive result. [phone]  Outgoing call by clinical staff:  Discussed the following:  Cervicovaginal ancillary only Order: [id number]  Status: Edited Result - FINAL     Next appt: None   Test Result Released: Yes (seen)   1 Result Note    Component Ref Range & Units (hover) 2 d ago  Neisseria Gonorrhea Negative  Chlamydia Negative  Trichomonas Negative  Bacterial Vaginitis (gardnerella) Positive Abnormal   Candida Vaginitis Negative  Candida Glabrata Negative  Comment Normal Reference Range Bacterial Vaginosis - Negative  Comment Normal Reference Range Candida Species - Negative  Comment Normal Reference Range Candida Galbrata - Negative  Comment Normal Reference Range Trichomonas - Negative  Comment Normal Reference Ranger Chlamydia - Negative  Comment Normal Reference Range Neisseria Gonorrhea - Negative  Resulting Agency [facility]        Specimen Collected: 03/20/23 19:46 Last Resulted: 03/21/23 15:28   Pharmacy: [facility] [address] [facility] [place]  I am use to pills and requests that treatment and Diflucan  for possible yeast infection that may occur.   Also discussed in length the following (note) on result:   Katlynne A Pelkey, RN 03/21/2023 10:16 PM EST Back to Top    Patient went home on Metronidazole    Confirmed with patient and Provider (No previous Rx sent and patient did not have this prescribed, this was pending results of test)  Message sent to Nurse to make Addendum to chart/lab/note.  WENDI Dixon CMA  Routed to Provider Lundy for Rx's and advisement) and LOIS Madden, RN to addend chart/note.

## 2023-03-24 NOTE — ED Provider Notes (Signed)
 [name]    CSN: [id number] Arrival date & time: 03/20/23  1802      History   Chief Complaint No chief complaint on file.   HPI Barbara Yang is a 43 y.o. female.   Patient here today for evaluation of vaginal discharge and odor that started about 4 days ago.  She denies any concerns for STDs.  She did has not had any known STD exposure.  The history is provided by the patient.    Past Medical History:  Diagnosis Date   Cough    non productive   Heartburn during pregnancy    takes Tums daily   Medical history non-contributory     Patient Active Problem List   Diagnosis Date Noted   Active labor 09/26/2014   NVD (normal vaginal delivery) 09/26/2014   Evaluate anatomy not seen on prior sonogram    Obesity affecting pregnancy, antepartum    Multiple marker screen positive for Down syndrome    AMA (advanced maternal age) multigravida 35+    [redacted] weeks gestation of pregnancy    Encounter for fetal anatomic survey    Abnormal quad screen    Advanced maternal age in multigravida    Pregnancy 04/23/2014   Lisfranc dislocation 04/22/2014    Past Surgical History:  Procedure Laterality Date   HARDWARE REMOVAL Left 09/09/2014   Procedure: HARDWARE REMOVAL LEFT FOOT ;  Surgeon: Ozell Bruch, MD;  Location: [facility] OR;  Service: Orthopedics;  Laterality: Left;   ORIF ANKLE FRACTURE Left 04/22/2014   Procedure: OPEN REDUCTION INTERNAL FIXATION (ORIF) LEFT LISFRANC FRACTURE;  Surgeon: Ozell VEAR Bruch, MD;  Location: [facility] OR;  Service: Orthopedics;  Laterality: Left;   UMBILICAL HERNIA REPAIR      OB History     Gravida  7   Para  2   Term  2   Preterm  0   AB  4   Living  1      SAB  4   IAB  0   Ectopic  0   Multiple  0   Live Births  1            Home Medications    Prior to Admission medications   Medication Sig Start Date End Date Taking? Authorizing Provider  fluconazole  (DIFLUCAN ) 150 MG tablet Take 1 tablet (150 mg total) by  mouth daily for 3 days. If symptoms don't improve in 2 days take 2nd pill. 03/22/23 03/25/23  Billy Asberry FALCON, PA-C  metroNIDAZOLE  (FLAGYL ) 500 MG tablet Take 1 tablet (500 mg total) by mouth 2 (two) times daily for 7 days. 03/22/23 03/29/23  Billy Asberry FALCON, PA-C  ondansetron  (ZOFRAN -ODT) 4 MG disintegrating tablet Take 1 tablet (4 mg total) by mouth every 8 (eight) hours as needed for nausea or vomiting. 03/07/21   Shepard Clinch, PA-C    Family History Family History  Problem Relation Age of Onset   Breast cancer Mother    Diabetes Mother    Kidney disease Mother    Diabetes Maternal Grandmother    Diabetes Maternal Aunt     Social History Social History   Tobacco Use   Smoking status: Never   Smokeless tobacco: Never  Vaping Use   Vaping status: Never Used  Substance Use Topics   Alcohol use: Yes    Comment: 2 per day   Drug use: No     Allergies   Patient has no known allergies.   Review of Systems Review of  Systems  Constitutional:  Negative for chills and fever.  Eyes:  Negative for discharge and redness.  Respiratory:  Negative for shortness of breath.   Gastrointestinal:  Negative for abdominal pain, nausea and vomiting.  Genitourinary:  Positive for vaginal bleeding. Negative for genital sores.     Physical Exam Triage Vital Signs ED Triage Vitals  Encounter Vitals Group     BP 03/20/23 1918 119/61     Systolic BP Percentile --      Diastolic BP Percentile --      Pulse Rate 03/20/23 1918 77     Resp 03/20/23 1918 17     Temp 03/20/23 1918 98.6 F (37 C)     Temp Source 03/20/23 1918 Oral     SpO2 03/20/23 1918 98 %     Weight 03/20/23 1916 184 lb (83.5 kg)     Height 03/20/23 1916 5' 1 (1.549 m)     Head Circumference --      Peak Flow --      Pain Score 03/20/23 1916 0     Pain Loc --      Pain Education --      Exclude from Growth Chart --    No data found.  Updated Vital Signs BP 119/61 (BP Location: Left Arm)   Pulse 77   Temp 98.6 F  (37 C) (Oral)   Resp 17   Ht 5' 1 (1.549 m)   Wt 184 lb (83.5 kg)   LMP 02/20/2023   SpO2 98%   BMI 34.77 kg/m   Visual Acuity Right Eye Distance:   Left Eye Distance:   Bilateral Distance:    Right Eye Near:   Left Eye Near:    Bilateral Near:     Physical Exam Vitals and nursing note reviewed.  Constitutional:      General: She is not in acute distress.    Appearance: Normal appearance. She is not ill-appearing.  HENT:     Head: Normocephalic and atraumatic.  Eyes:     Conjunctiva/sclera: Conjunctivae normal.  Cardiovascular:     Rate and Rhythm: Normal rate.  Pulmonary:     Effort: Pulmonary effort is normal. No respiratory distress.  Neurological:     Mental Status: She is alert.  Psychiatric:        Mood and Affect: Mood normal.        Behavior: Behavior normal.        Thought Content: Thought content normal.      UC Treatments / Results  Labs (all labs ordered are listed, but only abnormal results are displayed) Labs Reviewed  CERVICOVAGINAL ANCILLARY ONLY - Abnormal; Notable for the following components:      Result Value   Bacterial Vaginitis (gardnerella) Positive (*)    All other components within normal limits    EKG   Radiology No results found.  Procedures Procedures (including critical care time)  Medications Ordered in UC Medications - No data to display  Initial Impression / Assessment and Plan / UC Course  I have reviewed the triage vital signs and the nursing notes.  Pertinent labs & imaging results that were available during my care of the patient were reviewed by me and considered in my medical decision making (see chart for details).    Screening ordered for yeast, BV, gonorrhea, chlamydia and trichomonas.  Patient declines blood work today.  Will await results for further recommendation and encouraged follow-up with any further concerns.  Final Clinical Impressions(s) /  UC Diagnoses   Final diagnoses:  Acute vaginitis    Discharge Instructions   None    ED Prescriptions   None    PDMP not reviewed this encounter.   Billy Asberry FALCON, PA-C 03/24/23 1144

## 2023-06-07 ENCOUNTER — Ambulatory Visit: Admission: EM | Admit: 2023-06-07 | Discharge: 2023-06-07 | Disposition: A | Discharge status: Home / Self Care

## 2023-06-07 ENCOUNTER — Encounter: Payer: Self-pay | Admitting: Emergency Medicine

## 2023-06-07 ENCOUNTER — Ambulatory Visit

## 2023-06-07 DIAGNOSIS — R1030 Lower abdominal pain, unspecified: Secondary | ICD-10-CM | POA: Insufficient documentation

## 2023-06-07 DIAGNOSIS — B9689 Other specified bacterial agents as the cause of diseases classified elsewhere: Secondary | ICD-10-CM | POA: Diagnosis not present

## 2023-06-07 DIAGNOSIS — N760 Acute vaginitis: Secondary | ICD-10-CM | POA: Insufficient documentation

## 2023-06-07 LAB — POCT URINALYSIS DIP (MANUAL ENTRY)
Bilirubin, UA: NEGATIVE
Blood, UA: NEGATIVE
Glucose, UA: NEGATIVE mg/dL
Ketones, POC UA: NEGATIVE mg/dL
Leukocytes, UA: NEGATIVE
Nitrite, UA: NEGATIVE
Protein Ur, POC: NEGATIVE mg/dL
Spec Grav, UA: >=1.03 — AB
Urobilinogen, UA: 0.2 U/dL
pH, UA: 6.5

## 2023-06-07 MED ORDER — FLUCONAZOLE 150 MG PO TABS: 150.0000 mg | ORAL_TABLET | ORAL | 0 refills | Status: DC

## 2023-06-07 MED ORDER — METRONIDAZOLE 500 MG PO TABS
500.0000 mg | ORAL_TABLET | Freq: Two times a day (BID) | ORAL | 0 refills | Status: DC
Start: 2023-06-07 — End: 2024-01-28

## 2023-06-07 NOTE — ED Triage Notes (Signed)
 Pt reports suprapubic discomfort and pressure x3 days. Pt notes that she used a scented soap prior to symptoms starting and states this is what it felt like when I had BV in the past. Pt also states waiting to go to the bathroom for long periods of time at work. She would like to be tested for BV and UTI.

## 2023-06-07 NOTE — Discharge Instructions (Signed)
 I have prescribed you metronidazole due to concern for bacterial vaginosis.  Vaginal swab pending.  Follow-up if any symptoms persist or worsen.

## 2023-06-07 NOTE — ED Provider Notes (Signed)
 [name]    CSN: [id number] Arrival date & time: 06/07/23  1755      History   Chief Complaint Chief Complaint  Patient presents with   Abdominal Pain    HPI Barbara Yang is a 43 y.o. female.   Patient presents with lower abdominal discomfort and pressure that started about 3 days ago.  Reports that she has had similar symptoms in the past with bacterial vaginosis.  States that she used a scented soap by accident while washing her vaginal area recently, and symptoms started subsequently after.  Denies dysuria, urinary frequency, flank pain, fever.  Denies exposure to STD.  Patient would like testing for UTI and bacterial vaginosis today.   Abdominal Pain   Past Medical History:  Diagnosis Date   Cough    non productive   Heartburn during pregnancy    takes Tums daily   Medical history non-contributory     Patient Active Problem List   Diagnosis Date Noted   Obesity 11/22/2022   Contact with and (suspected) exposure to potentially hazardous body fluids 06/07/2021   Active labor 09/26/2014   NVD (normal vaginal delivery) 09/26/2014   Evaluate anatomy not seen on prior sonogram    Multiple marker screen positive for Down syndrome    AMA (advanced maternal age) multigravida 35+    [redacted] weeks gestation of pregnancy    Encounter for fetal anatomic survey    Abnormal quad screen    Advanced maternal age in multigravida    Pregnancy 04/23/2014   Lisfranc dislocation 04/22/2014    Past Surgical History:  Procedure Laterality Date   HARDWARE REMOVAL Left 09/09/2014   Procedure: HARDWARE REMOVAL LEFT FOOT ;  Surgeon: Myrene Galas, MD;  Location: [facility] OR;  Service: Orthopedics;  Laterality: Left;   ORIF ANKLE FRACTURE Left 04/22/2014   Procedure: OPEN REDUCTION INTERNAL FIXATION (ORIF) LEFT LISFRANC FRACTURE;  Surgeon: Budd Palmer, MD;  Location: [facility] OR;  Service: Orthopedics;  Laterality: Left;   UMBILICAL HERNIA REPAIR      OB History     Gravida   7   Para  2   Term  2   Preterm  0   AB  4   Living  1      SAB  4   IAB  0   Ectopic  0   Multiple  0   Live Births  1            Home Medications    Prior to Admission medications   Medication Sig Start Date End Date Taking? Authorizing Provider  fluconazole (DIFLUCAN) 150 MG tablet Take 1 tablet (150 mg total) by mouth every 3 (three) days. 06/07/23  Yes Dorr Perrot, Rolly Salter E, FNP  metroNIDAZOLE (FLAGYL) 500 MG tablet Take 1 tablet (500 mg total) by mouth 2 (two) times daily. 06/07/23  Yes Jacobo Moncrief, Acie Fredrickson, FNP  hydrOXYzine (ATARAX) 10 MG tablet hydroxyzine HCl 10 mg tablet Patient not taking: Reported on 06/07/2023    [provider]  methocarbamol (ROBAXIN) 750 MG tablet TAKE 1 TABLET BY MOUTH THREE TIMES DAILY AS NEEDED FOR NECK SPASMS Patient not taking: Reported on 06/07/2023 01/13/22   [provider]  ondansetron (ZOFRAN-ODT) 4 MG disintegrating tablet Take 1 tablet (4 mg total) by mouth every 8 (eight) hours as needed for nausea or vomiting. Patient not taking: Reported on 06/07/2023 03/07/21   Tanda Rockers, PA-C  sertraline (ZOLOFT) 100 MG tablet sertraline 100 mg tablet Patient not taking: Reported  on 06/07/2023    [provider]  sertraline (ZOLOFT) 50 MG tablet sertraline 50 mg tablet Patient not taking: Reported on 06/07/2023    [provider]  terbinafine (LAMISIL) 250 MG tablet terbinafine HCl 250 mg tablet  TAKE 1 TABLET BY MOUTH EVERY DAY Patient not taking: Reported on 06/07/2023    [provider]  tinidazole (TINDAMAX) 500 MG tablet tinidazole 500 mg tablet  Take 4 tabs PO together in one single dose Patient not taking: Reported on 06/07/2023    [provider]  traZODone (DESYREL) 50 MG tablet trazodone 50 mg tablet Patient not taking: Reported on 06/07/2023    [provider]    Family History Family History  Problem Relation Age of Onset   Breast cancer Mother    Diabetes Mother     Kidney disease Mother    Diabetes Maternal Grandmother    Diabetes Maternal Aunt     Social History Social History   Tobacco Use   Smoking status: Never   Smokeless tobacco: Never  Vaping Use   Vaping status: Never Used  Substance Use Topics   Alcohol use: Yes    Comment: social   Drug use: No     Allergies   Patient has no known allergies.   Review of Systems Review of Systems Per HPI  Physical Exam Triage Vital Signs ED Triage Vitals [06/07/23 1840]  Encounter Vitals Group     BP 120/81     Systolic BP Percentile      Diastolic BP Percentile      Pulse Rate 70     Resp 16     Temp 98.7 F (37.1 C)     Temp Source Oral     SpO2 96 %     Weight      Height      Head Circumference      Peak Flow      Pain Score 4     Pain Loc      Pain Education      Exclude from Growth Chart    No data found.  Updated Vital Signs BP 120/81 (BP Location: Right Arm)   Pulse 70   Temp 98.7 F (37.1 C) (Oral)   Resp 16   LMP 05/21/2023 (Approximate)   SpO2 96%   Visual Acuity Right Eye Distance:   Left Eye Distance:   Bilateral Distance:    Right Eye Near:   Left Eye Near:    Bilateral Near:     Physical Exam Constitutional:      General: She is not in acute distress.    Appearance: Normal appearance. She is not toxic-appearing or diaphoretic.  HENT:     Head: Normocephalic and atraumatic.  Eyes:     Extraocular Movements: Extraocular movements intact.     Conjunctiva/sclera: Conjunctivae normal.  Pulmonary:     Effort: Pulmonary effort is normal.  Genitourinary:    Comments: Deferred with shared decision making.  Self swab performed. Neurological:     General: No focal deficit present.     Mental Status: She is alert and oriented to person, place, and time. Mental status is at baseline.  Psychiatric:        Mood and Affect: Mood normal.        Behavior: Behavior normal.        Thought Content: Thought content normal.        Judgment: Judgment  normal.      UC Treatments /  Results  Labs (all labs ordered are listed, but only abnormal results are displayed) Labs Reviewed  POCT URINALYSIS DIP (MANUAL ENTRY) - Abnormal; Notable for the following components:      Result Value   Spec Grav, UA >=1.030 (*)    All other components within normal limits  CERVICOVAGINAL ANCILLARY ONLY    EKG   Radiology No results found.  Procedures Procedures (including critical care time)  Medications Ordered in UC Medications - No data to display  Initial Impression / Assessment and Plan / UC Course  I have reviewed the triage vital signs and the nursing notes.  Pertinent labs & imaging results that were available during my care of the patient were reviewed by me and considered in my medical decision making (see chart for details).     UA unremarkable.  I am mildly suspicious of bacterial vaginosis given symptoms started after using a scented soap.  Therefore, will opt to treat with metronidazole while awaiting cervicovaginal swab.  Patient requesting Diflucan as well given antibiotics typically give her yeast infection so this was prescribed to take at first sign of vaginal yeast.  Advised strict follow-up precautions.  Patient verbalized understanding and was agreeable with plan. Final Clinical Impressions(s) / UC Diagnoses   Final diagnoses:  Bacterial vaginosis     Discharge Instructions      I have prescribed you metronidazole due to concern for bacterial vaginosis.  Vaginal swab pending.  Follow-up if any symptoms persist or worsen.    ED Prescriptions     Medication Sig Dispense Auth. Provider   metroNIDAZOLE (FLAGYL) 500 MG tablet Take 1 tablet (500 mg total) by mouth 2 (two) times daily. 14 tablet [place], [place] E, [place]   fluconazole (DIFLUCAN) 150 MG tablet Take 1 tablet (150 mg total) by mouth every 3 (three) days. 2 tablet [place], Acie Fredrickson, [place]      PDMP not reviewed this encounter.   Gustavus Bryant, [place] 06/07/23  2013

## 2023-06-10 LAB — CERVICOVAGINAL ANCILLARY ONLY
Bacterial Vaginitis (gardnerella): NEGATIVE
Candida Glabrata: NEGATIVE
Candida Vaginitis: NEGATIVE
Chlamydia: NEGATIVE
Comment: NEGATIVE
Comment: NEGATIVE
Comment: NEGATIVE
Comment: NEGATIVE
Comment: NEGATIVE
Comment: NORMAL
Neisseria Gonorrhea: NEGATIVE
Trichomonas: NEGATIVE

## 2023-06-30 ENCOUNTER — Ambulatory Visit
Admission: EM | Admit: 2023-06-30 | Discharge: 2023-06-30 | Disposition: A | Discharge status: Home / Self Care | Attending: Physician Assistant | Admitting: Physician Assistant

## 2023-06-30 DIAGNOSIS — Z113 Encounter for screening for infections with a predominantly sexual mode of transmission: Secondary | ICD-10-CM | POA: Diagnosis present

## 2023-06-30 NOTE — ED Triage Notes (Signed)
"  I just want to get checked for Sti, Me and my husband aren't together anymore and I was with someone so I want to make sure I don't have anything". "No symptoms". "I don't need blood work, just swab".

## 2023-06-30 NOTE — ED Provider Notes (Signed)
 [name]    CSN: [id number] Arrival date & time: 06/30/23  1205      History   Chief Complaint Chief Complaint  Patient presents with   SEXUALLY TRANSMITTED DISEASE    HPI Barbara Yang is a 43 y.o. female.   Patient here today for STD screening.  She denies any current symptoms.  She declines blood work.  The history is provided by the patient.    Past Medical History:  Diagnosis Date   Cough    non productive   Heartburn during pregnancy    takes Tums daily   Medical history non-contributory     Patient Active Problem List   Diagnosis Date Noted   Obesity 11/22/2022   Contact with and (suspected) exposure to potentially hazardous body fluids 06/07/2021   Active labor 09/26/2014   NVD (normal vaginal delivery) 09/26/2014   Evaluate anatomy not seen on prior sonogram    Multiple marker screen positive for Down syndrome    AMA (advanced maternal age) multigravida 35+    [redacted] weeks gestation of pregnancy    Encounter for fetal anatomic survey    Abnormal quad screen    Advanced maternal age in multigravida    Pregnancy 04/23/2014   Lisfranc dislocation 04/22/2014    Past Surgical History:  Procedure Laterality Date   HARDWARE REMOVAL Left 09/09/2014   Procedure: HARDWARE REMOVAL LEFT FOOT ;  Surgeon: Hardy Lia, MD;  Location: [facility] OR;  Service: Orthopedics;  Laterality: Left;   ORIF ANKLE FRACTURE Left 04/22/2014   Procedure: OPEN REDUCTION INTERNAL FIXATION (ORIF) LEFT LISFRANC FRACTURE;  Surgeon: Arlette Lagos, MD;  Location: [facility] OR;  Service: Orthopedics;  Laterality: Left;   UMBILICAL HERNIA REPAIR      OB History     Gravida  7   Para  2   Term  2   Preterm  0   AB  4   Living  1      SAB  4   IAB  0   Ectopic  0   Multiple  0   Live Births  1            Home Medications    Prior to Admission medications   Medication Sig Start Date End Date Taking? Authorizing Provider  fluconazole (DIFLUCAN) 150 MG  tablet Take 1 tablet (150 mg total) by mouth every 3 (three) days. 06/07/23   Dodson Freestone, FNP  hydrOXYzine (ATARAX) 10 MG tablet hydroxyzine HCl 10 mg tablet Patient not taking: Reported on 06/07/2023    [provider]  methocarbamol (ROBAXIN) 750 MG tablet TAKE 1 TABLET BY MOUTH THREE TIMES DAILY AS NEEDED FOR NECK SPASMS Patient not taking: Reported on 06/07/2023 01/13/22   [provider]  metroNIDAZOLE (FLAGYL) 500 MG tablet Take 1 tablet (500 mg total) by mouth 2 (two) times daily. 06/07/23   Dodson Freestone, FNP  ondansetron (ZOFRAN-ODT) 4 MG disintegrating tablet Take 1 tablet (4 mg total) by mouth every 8 (eight) hours as needed for nausea or vomiting. Patient not taking: Reported on 06/07/2023 03/07/21   Venter, Margaux, PA-C  sertraline (ZOLOFT) 100 MG tablet sertraline 100 mg tablet Patient not taking: Reported on 06/07/2023    [provider]  sertraline (ZOLOFT) 50 MG tablet sertraline 50 mg tablet Patient not taking: Reported on 06/07/2023    [provider]  terbinafine (LAMISIL) 250 MG tablet terbinafine HCl 250 mg tablet  TAKE 1 TABLET BY MOUTH EVERY DAY Patient  not taking: Reported on 06/07/2023    [provider]  tinidazole (TINDAMAX) 500 MG tablet tinidazole 500 mg tablet  Take 4 tabs PO together in one single dose Patient not taking: Reported on 06/07/2023    [provider]  traZODone (DESYREL) 50 MG tablet trazodone 50 mg tablet Patient not taking: Reported on 06/07/2023    [provider]    Family History Family History  Problem Relation Age of Onset   Breast cancer Mother    Diabetes Mother    Kidney disease Mother    Diabetes Maternal Grandmother    Diabetes Maternal Aunt     Social History Social History   Tobacco Use   Smoking status: Never   Smokeless tobacco: Never  Vaping Use   Vaping status: Never Used  Substance Use Topics   Alcohol use: Yes    Comment: social   Drug use: No      Allergies   Patient has no known allergies.   Review of Systems Review of Systems  Constitutional:  Negative for chills and fever.  Eyes:  Negative for discharge and redness.  Respiratory:  Negative for shortness of breath.   Gastrointestinal:  Negative for abdominal pain, nausea and vomiting.  Genitourinary:  Negative for genital sores, vaginal bleeding and vaginal discharge.     Physical Exam Triage Vital Signs ED Triage Vitals  Encounter Vitals Group     BP      Systolic BP Percentile      Diastolic BP Percentile      Pulse      Resp      Temp      Temp src      SpO2      Weight      Height      Head Circumference      Peak Flow      Pain Score      Pain Loc      Pain Education      Exclude from Growth Chart    No data found.  Updated Vital Signs BP 100/65 (BP Location: Left Arm)   Pulse 79   Temp 98.1 F (36.7 C) (Oral)   Resp 18   Ht 5\' 1"  (1.549 m)   Wt 184 lb 1.4 oz (83.5 kg)   LMP 06/14/2023 (Approximate)   SpO2 98%   BMI 34.78 kg/m   Visual Acuity Right Eye Distance:   Left Eye Distance:   Bilateral Distance:    Right Eye Near:   Left Eye Near:    Bilateral Near:     Physical Exam Vitals and nursing note reviewed.  Constitutional:      General: She is not in acute distress.    Appearance: Normal appearance. She is not ill-appearing.  HENT:     Head: Normocephalic and atraumatic.  Eyes:     Conjunctiva/sclera: Conjunctivae normal.  Cardiovascular:     Rate and Rhythm: Normal rate.  Pulmonary:     Effort: Pulmonary effort is normal. No respiratory distress.  Neurological:     Mental Status: She is alert.  Psychiatric:        Mood and Affect: Mood normal.        Behavior: Behavior normal.        Thought Content: Thought content normal.      UC Treatments / Results  Labs (all labs ordered are listed, but only abnormal results are displayed) Labs Reviewed  CERVICOVAGINAL ANCILLARY ONLY    EKG  Radiology No  results found.  Procedures Procedures (including critical care time)  Medications Ordered in UC Medications - No data to display  Initial Impression / Assessment and Plan / UC Course  I have reviewed the triage vital signs and the nursing notes.  Pertinent labs & imaging results that were available during my care of the patient were reviewed by me and considered in my medical decision making (see chart for details).    STD screening ordered as requested.  Will await results for further recommendation.  Encouraged follow-up with any further concerns.  Final Clinical Impressions(s) / UC Diagnoses   Final diagnoses:  Screening for STD (sexually transmitted disease)   Discharge Instructions   None    ED Prescriptions   None    PDMP not reviewed this encounter.   Vernestine Gondola, PA-C 06/30/23 [phone]

## 2023-07-03 LAB — CERVICOVAGINAL ANCILLARY ONLY
Bacterial Vaginitis (gardnerella): NEGATIVE
Candida Glabrata: NEGATIVE
Candida Vaginitis: NEGATIVE
Chlamydia: NEGATIVE
Comment: NEGATIVE
Comment: NEGATIVE
Comment: NEGATIVE
Comment: NEGATIVE
Comment: NEGATIVE
Comment: NORMAL
Neisseria Gonorrhea: NEGATIVE
Trichomonas: NEGATIVE

## 2023-12-27 DIAGNOSIS — L409 Psoriasis, unspecified: Secondary | ICD-10-CM | POA: Insufficient documentation

## 2023-12-27 DIAGNOSIS — E569 Vitamin deficiency, unspecified: Secondary | ICD-10-CM | POA: Insufficient documentation

## 2024-01-11 ENCOUNTER — Ambulatory Visit: Admission: EM | Admit: 2024-01-11 | Discharge: 2024-01-11 | Disposition: A | Discharge status: Home / Self Care

## 2024-01-11 ENCOUNTER — Ambulatory Visit

## 2024-01-11 DIAGNOSIS — S91331A Puncture wound without foreign body, right foot, initial encounter: Secondary | ICD-10-CM

## 2024-01-11 MED ORDER — TETANUS-DIPHTH-ACELL PERTUSSIS 5-2-15.5 LF-MCG/0.5 IM SUSP
0.5000 mL | Freq: Once | INTRAMUSCULAR | Status: AC
  Administered 2024-01-11: 0.5 mL via INTRAMUSCULAR

## 2024-01-11 MED ORDER — BACITRACIN ZINC 500 UNIT/GM EX OINT
TOPICAL_OINTMENT | Freq: Once | CUTANEOUS | Status: AC
Start: 2024-01-11 — End: 2024-01-11
  Administered 2024-01-11: 1 via TOPICAL

## 2024-01-11 MED ORDER — CEPHALEXIN 500 MG PO CAPS: 500.0000 mg | ORAL_CAPSULE | Freq: Three times a day (TID) | ORAL | 0 refills | Status: AC

## 2024-01-11 MED ORDER — ACETAMINOPHEN 325 MG PO TABS
975.0000 mg | ORAL_TABLET | Freq: Once | ORAL | Status: AC
  Administered 2024-01-11: 975 mg via ORAL

## 2024-01-11 NOTE — ED Triage Notes (Signed)
 Patient reports while going outside to get kerosene heater stepped on a nail that went through my right foot. Last Tdap (2016).

## 2024-01-11 NOTE — Discharge Instructions (Addendum)
  1. Puncture wound of right foot, initial encounter (Primary) - Tdap (ADACEL) injection 0.5 mL given in UC for infection prevention - DG Foot Complete Right x-ray performed in UC shows no acute fracture or dislocation, no foreign body noted, pes planus deformity as well as calcaneal spur noted. - cephALEXin (KEFLEX) 500 MG capsule; Take 1 capsule (500 mg total) by mouth 3 (three) times daily for 7 days.  Dispense: 21 capsule; Refill: 0 -Patient reports frequent vaginal yeast infection after antibiotic use.  If symptoms present, prescription for Diflucan  150 mg x 2 doses can be prescribed to preferred pharmacy for treatment of vaginal Candida infection. - Take Motrin  600 to 800 mg every 8 hours as needed for inflammation and pain secondary to right foot puncture wound. -Continue to monitor symptoms for any change in severity if there is any escalation of current symptoms or development of new symptoms follow-up in ER for further evaluation and management.

## 2024-01-11 NOTE — ED Provider Notes (Signed)
 [facility]  Note:  This document was prepared using Conservation officer, historic buildings and may include unintentional dictation errors.  MRN: [id number] DOB: 01/25/1981  Subjective:   Barbara Yang is a 43 y.o. female presenting for evaluation of right foot after stepping on a nail while walking outside.  Patient reports that she was walking outside to get kerosene heater when she stepped on what she believes is a nail causing a puncture wound to her foot.  Patient reports that her last Tdap was in 2016.  Patient denies any other secondary medical concern at this time.   Current Facility-Administered Medications:    acetaminophen  (TYLENOL ) tablet 975 mg, 975 mg, Oral, Once, Toshia Larkin B, NP   bacitracin ointment, , Topical, Once, Laurabeth Yip B, NP  Current Outpatient Medications:    cephALEXin (KEFLEX) 500 MG capsule, Take 1 capsule (500 mg total) by mouth 3 (three) times daily for 7 days., Disp: 21 capsule, Rfl: 0   Cholecalciferol 1.25 MG (50000 UT) capsule, Take 1 capsule every week by oral route for 30 days., Disp: , Rfl:    Dapsone (ACZONE) 7.5 % GEL, APPLY A PEA-SIZED AMOUNT BY TOPICAL ROUTE ONCE DAILY TO COVER AREAS OF FACE WITH THIN LAYER ; RUB IN GENTLY AND COMPLETELY, Disp: , Rfl:    doxycycline  (VIBRAMYCIN ) 100 MG capsule, , Disp: , Rfl:    fluconazole  (DIFLUCAN ) 150 MG tablet, Take 1 tablet (150 mg total) by mouth every 3 (three) days., Disp: 2 tablet, Rfl: 0   hydrOXYzine (ATARAX) 10 MG tablet, hydroxyzine HCl 10 mg tablet (Patient not taking: Reported on 06/07/2023), Disp: , Rfl:    methocarbamol (ROBAXIN) 750 MG tablet, TAKE 1 TABLET BY MOUTH THREE TIMES DAILY AS NEEDED FOR NECK SPASMS (Patient not taking: Reported on 06/07/2023), Disp: , Rfl:    metroNIDAZOLE  (FLAGYL ) 500 MG tablet, Take 1 tablet (500 mg total) by mouth 2 (two) times daily., Disp: 14 tablet, Rfl: 0   metroNIDAZOLE  (FLAGYL ) 500 MG tablet, Take 500 mg by mouth 2 (two) times daily.,  Disp: , Rfl:    ondansetron  (ZOFRAN -ODT) 4 MG disintegrating tablet, Take 1 tablet (4 mg total) by mouth every 8 (eight) hours as needed for nausea or vomiting. (Patient not taking: Reported on 06/07/2023), Disp: 20 tablet, Rfl: 0   sertraline (ZOLOFT) 100 MG tablet, sertraline 100 mg tablet (Patient not taking: Reported on 06/07/2023), Disp: , Rfl:    sertraline (ZOLOFT) 50 MG tablet, sertraline 50 mg tablet (Patient not taking: Reported on 06/07/2023), Disp: , Rfl:    terbinafine  (LAMISIL ) 250 MG tablet, terbinafine  HCl 250 mg tablet  TAKE 1 TABLET BY MOUTH EVERY DAY (Patient not taking: Reported on 06/07/2023), Disp: , Rfl:    tinidazole (TINDAMAX) 500 MG tablet, tinidazole 500 mg tablet  Take 4 tabs PO together in one single dose (Patient not taking: Reported on 06/07/2023), Disp: , Rfl:    traZODone (DESYREL) 50 MG tablet, trazodone 50 mg tablet (Patient not taking: Reported on 06/07/2023), Disp: , Rfl:    No Known Allergies  Past Medical History:  Diagnosis Date   Cough    non productive   Heartburn during pregnancy    takes Tums daily   Medical history non-contributory      Past Surgical History:  Procedure Laterality Date   HARDWARE REMOVAL Left 09/09/2014   Procedure: HARDWARE REMOVAL LEFT FOOT ;  Surgeon: Ozell Bruch, MD;  [facility]: [facility];  Service: Orthopedics;  Laterality: Left;   ORIF ANKLE FRACTURE Left 04/22/2014  Procedure: OPEN REDUCTION INTERNAL FIXATION (ORIF) LEFT LISFRANC FRACTURE;  Surgeon: Ozell VEAR Bruch, MD;  Location: [facility] OR;  Service: Orthopedics;  Laterality: Left;   UMBILICAL HERNIA REPAIR      Family History  Problem Relation Age of Onset   Breast cancer Mother    Diabetes Mother    Kidney disease Mother    Diabetes Maternal Grandmother    Diabetes Maternal Aunt     Social History   Tobacco Use   Smoking status: Never   Smokeless tobacco: Never  Vaping Use   Vaping status: Never Used  Substance Use Topics   Alcohol use: Yes    Comment: social    Drug use: No    ROS Refer to HPI for ROS details.  Objective:   Vitals: BP 107/74 (BP Location: Left Arm)   Pulse 82   Temp 98.2 F (36.8 C) (Temporal)   Resp 20   Ht 5' 1 (1.549 m)   Wt 190 lb (86.2 kg)   LMP 01/10/2024 (Exact Date)   SpO2 96%   BMI 35.90 kg/m   Physical Exam Vitals and nursing note reviewed.  Constitutional:      General: She is not in acute distress.    Appearance: Normal appearance. She is not ill-appearing.  HENT:     Head: Normocephalic.  Cardiovascular:     Rate and Rhythm: Normal rate.  Pulmonary:     Effort: Pulmonary effort is normal. No respiratory distress.  Skin:    General: Skin is warm and dry.     Capillary Refill: Capillary refill takes less than 2 seconds.     Findings: Erythema and wound present. No abrasion, bruising, ecchymosis or rash.  Neurological:     General: No focal deficit present.     Mental Status: She is alert and oriented to person, place, and time.  Psychiatric:        Mood and Affect: Mood normal.        Behavior: Behavior normal.     Procedures  No results found for this or any previous visit (from the past 24 hours).  DG Foot Complete Right Result Date: 01/11/2024 EXAM: 3 OR MORE VIEW(S) XRAY OF THE RIGHT FOOT 01/11/2024 09:20:24 AM COMPARISON: None available. CLINICAL HISTORY: Puncture right foot. Patient reports while going outside to get kerosene heater stepped on a nail that went through my right foot. FINDINGS: BONES AND JOINTS: No acute fracture. No joint dislocation. Small inferior calcaneal spur. 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 No foreign body. SOFT TISSUES: The soft tissues are unremarkable. IMPRESSION: 1. No acute fracture or dislocation. 2. No foreign body. 3. Osteoarthritis of the tibiotalar joint. 4. Pes planus deformity. 5. Small inferior calcaneal spur. 6. Accessory os naviculare. Electronically signed by: Norleen Boxer MD 01/11/2024  09:47 AM EDT RP Workstation: [id number]     Assessment and Plan :     Discharge Instructions       1. Puncture wound of right foot, initial encounter (Primary) - Tdap (ADACEL) injection 0.5 mL given in UC for infection prevention - DG Foot Complete Right x-ray performed in UC shows no acute fracture or dislocation, no foreign body noted, pes planus deformity as well as calcaneal spur noted. - cephALEXin (KEFLEX) 500 MG capsule; Take 1 capsule (500 mg total) by mouth 3 (three) times daily for 7 days.  Dispense: 21 capsule; Refill: 0 -Patient reports frequent vaginal yeast infection after antibiotic use.  If symptoms present,  prescription for Diflucan  150 mg x 2 doses can be prescribed to preferred pharmacy for treatment of vaginal Candida infection. - Take Motrin  600 to 800 mg every 8 hours as needed for inflammation and pain secondary to right foot puncture wound. -Continue to monitor symptoms for any change in severity if there is any escalation of current symptoms or development of new symptoms follow-up in ER for further evaluation and management.       Sheyanne Munley B Tyvon Eggenberger   Churchill Grimsley, [place] B, [place] 01/11/24 1007

## 2024-01-28 ENCOUNTER — Ambulatory Visit
Admission: RE | Admit: 2024-01-28 | Discharge: 2024-01-28 | Disposition: A | Discharge status: Home / Self Care | Source: Ambulatory Visit | Attending: Student | Admitting: Student

## 2024-01-28 ENCOUNTER — Other Ambulatory Visit: Payer: Self-pay

## 2024-01-28 VITALS — BP 104/75 | HR 77 | Temp 98.5°F | Resp 18

## 2024-01-28 DIAGNOSIS — Z113 Encounter for screening for infections with a predominantly sexual mode of transmission: Secondary | ICD-10-CM | POA: Insufficient documentation

## 2024-01-28 DIAGNOSIS — B9689 Other specified bacterial agents as the cause of diseases classified elsewhere: Secondary | ICD-10-CM | POA: Diagnosis present

## 2024-01-28 DIAGNOSIS — N760 Acute vaginitis: Secondary | ICD-10-CM | POA: Diagnosis not present

## 2024-01-28 MED ORDER — METRONIDAZOLE 500 MG PO TABS: 500.0000 mg | ORAL_TABLET | Freq: Two times a day (BID) | ORAL | 0 refills | Status: AC

## 2024-01-28 MED ORDER — FLUCONAZOLE 150 MG PO TABS: 150.0000 mg | ORAL_TABLET | Freq: Every day | ORAL | 0 refills | Status: AC

## 2024-01-28 NOTE — ED Triage Notes (Signed)
 Pt here for have wound checked on right foot from stepping on rusty nail; pt sts well healed; pt also reports sx of BV since using sented toilet paper

## 2024-01-28 NOTE — ED Provider Notes (Signed)
 [name]    CSN: [id number] Arrival date & time: 01/28/24  1614      History   Chief Complaint Chief Complaint  Patient presents with   Vaginal Discharge   Wound Check    HPI Barbara Yang is a 43 y.o. female presenting for wound check and vaginitis.  Pt here for have wound checked on right foot from stepping on rusty nail 01/11/24; she completed the keflex, and pt sts well healed; pt also reports sx of BV since using sented toilet paper. Endorses increased discharge. Denies abd pain. Took a tablet of metronidazole  that she had on hand and it improved the symptoms.   HPI  Past Medical History:  Diagnosis Date   Cough    non productive   Heartburn during pregnancy    takes Tums daily   Medical history non-contributory     Patient Active Problem List   Diagnosis Date Noted   Vitamin deficiency 12/27/2023   Psoriasis 12/27/2023   Obesity 11/22/2022   Contact with and (suspected) exposure to potentially hazardous body fluids 06/07/2021   Active labor 09/26/2014   NVD (normal vaginal delivery) 09/26/2014   Evaluate anatomy not seen on prior sonogram    Multiple marker screen positive for Down syndrome    AMA (advanced maternal age) multigravida 35+    [redacted] weeks gestation of pregnancy    Encounter for fetal anatomic survey    Abnormal quad screen    Advanced maternal age in multigravida    Pregnancy 04/23/2014   Lisfranc dislocation 04/22/2014    Past Surgical History:  Procedure Laterality Date   HARDWARE REMOVAL Left 09/09/2014   Procedure: HARDWARE REMOVAL LEFT FOOT ;  Surgeon: Ozell Bruch, MD;  Location: [facility] OR;  Service: Orthopedics;  Laterality: Left;   ORIF ANKLE FRACTURE Left 04/22/2014   Procedure: OPEN REDUCTION INTERNAL FIXATION (ORIF) LEFT LISFRANC FRACTURE;  Surgeon: Ozell VEAR Bruch, MD;  Location: [facility] OR;  Service: Orthopedics;  Laterality: Left;   UMBILICAL HERNIA REPAIR      OB History     Gravida  7   Para  2   Term  2    Preterm  0   AB  4   Living  1      SAB  4   IAB  0   Ectopic  0   Multiple  0   Live Births  1            Home Medications    Prior to Admission medications   Medication Sig Start Date End Date Taking? Authorizing Provider  fluconazole  (DIFLUCAN ) 150 MG tablet Take 1 tablet (150 mg total) by mouth daily. -For your yeast infection, start the Diflucan  (fluconazole )- Take one pill today (day 1). If you're still having symptoms in 3 days, take the second pill. 01/28/24  Yes Iline Buchinger E, PA-C  Cholecalciferol 1.25 MG (50000 UT) capsule Take 1 capsule every week by oral route for 30 days. 12/30/23   [provider]  Dapsone (ACZONE) 7.5 % GEL APPLY A PEA-SIZED AMOUNT BY TOPICAL ROUTE ONCE DAILY TO COVER AREAS OF FACE WITH THIN LAYER ; RUB IN GENTLY AND COMPLETELY 10/19/21   [provider]  doxycycline  (VIBRAMYCIN ) 100 MG capsule     [provider]  metroNIDAZOLE  (FLAGYL ) 500 MG tablet Take 1 tablet (500 mg total) by mouth 2 (two) times daily for 7 days. 01/28/24 02/04/24  Arlyss Leita BRAVO, PA-C    Family History Family History  Problem Relation Age of Onset   Breast cancer Mother    Diabetes Mother    Kidney disease Mother    Diabetes Maternal Grandmother    Diabetes Maternal Aunt     Social History Social History   Tobacco Use   Smoking status: Never   Smokeless tobacco: Never  Vaping Use   Vaping status: Never Used  Substance Use Topics   Alcohol use: Yes    Comment: social   Drug use: No     Allergies   Patient has no known allergies.   Review of Systems Review of Systems  Constitutional:  Negative for appetite change, chills, diaphoresis and fever.  Respiratory:  Negative for shortness of breath.   Cardiovascular:  Negative for chest pain.  Gastrointestinal:  Negative for abdominal pain, blood in stool, constipation, diarrhea, nausea and vomiting.  Genitourinary:  Positive for vaginal discharge. Negative for decreased  urine volume, difficulty urinating, dysuria, flank pain, frequency, genital sores, hematuria and urgency.  Musculoskeletal:  Negative for back pain.  Neurological:  Negative for dizziness, weakness and light-headedness.  All other systems reviewed and are negative.    Physical Exam Triage Vital Signs ED Triage Vitals [01/28/24 1637]  Encounter Vitals Group     BP 104/75     Girls Systolic BP Percentile      Girls Diastolic BP Percentile      Boys Systolic BP Percentile      Boys Diastolic BP Percentile      Pulse Rate 77     Resp 18     Temp 98.5 F (36.9 C)     Temp Source Oral     SpO2 97 %     Weight      Height      Head Circumference      Peak Flow      Pain Score      Pain Loc      Pain Education      Exclude from Growth Chart    No data found.  Updated Vital Signs BP 104/75 (BP Location: Left Arm)   Pulse 77   Temp 98.5 F (36.9 C) (Oral)   Resp 18   LMP 01/10/2024 (Exact Date)   SpO2 97%   Visual Acuity Right Eye Distance:   Left Eye Distance:   Bilateral Distance:    Right Eye Near:   Left Eye Near:    Bilateral Near:     Physical Exam Vitals reviewed.  Constitutional:      General: She is not in acute distress.    Appearance: Normal appearance. She is not ill-appearing.  HENT:     Head: Normocephalic and atraumatic.     Mouth/Throat:     Mouth: Mucous membranes are moist.     Comments: Moist mucous membranes Eyes:     Extraocular Movements: Extraocular movements intact.     Pupils: Pupils are equal, round, and reactive to light.  Cardiovascular:     Rate and Rhythm: Normal rate and regular rhythm.     Heart sounds: Normal heart sounds.  Pulmonary:     Effort: Pulmonary effort is normal.     Breath sounds: Normal breath sounds. No wheezing, rhonchi or rales.  Abdominal:     General: Bowel sounds are normal. There is no distension.     Palpations: Abdomen is soft. There is no mass.     Tenderness: There is no abdominal tenderness. There  is no right CVA tenderness, left CVA tenderness, guarding  or rebound.  Skin:    General: Skin is warm.     Capillary Refill: Capillary refill takes less than 2 seconds.     Comments: See image below R foot with 3mm patch of hyperpigmented skin. No tenderness   Neurological:     General: No focal deficit present.     Mental Status: She is alert and oriented to person, place, and time.  Psychiatric:        Mood and Affect: Mood normal.        Behavior: Behavior normal.       UC Treatments / Results  Labs (all labs ordered are listed, but only abnormal results are displayed) Labs Reviewed  CERVICOVAGINAL ANCILLARY ONLY    EKG   Radiology No results found.  Procedures Procedures (including critical care time)  Medications Ordered in UC Medications - No data to display  Initial Impression / Assessment and Plan / UC Course  I have reviewed the triage vital signs and the nursing notes.  Pertinent labs & imaging results that were available during my care of the patient were reviewed by me and considered in my medical decision making (see chart for details).     Patient is a 43 year old female presenting for wound check, and vaginitis. The patient is afebrile and nontachycardic.  Antipyretic has not been administered today.  Wound is well-healed.  LMP 01/10/24, has not been sexually active since then, states she could not be pregnant.  Will treat empirically for BV while awaiting cervicovaginal swab.  Diflucan  sent to have on hand.  Final Clinical Impressions(s) / UC Diagnoses   Final diagnoses:  Routine screening for STI (sexually transmitted infection)  Bacterial vaginitis     Discharge Instructions      -For bacterial vaginosis, start the antibiotic-Flagyl  (metronidazole ), 2 pills daily for 7 days.  You can take this with food if you have a sensitive stomach.  Avoid alcohol while taking this medication and for 2 days after as this will cause severe nausea and  vomiting. -I sent Diflucan  to have on hand for yeast infection. For your yeast infection, start the Diflucan  (fluconazole )- Take one pill today (day 1). If you're still having symptoms in 3 days, take the second pill -We will call with any positive results in about 3 business days and can send treatment if necessary. -We will not call with negative lab results. -Lab results will automatically go to your MyChart.       ED Prescriptions     Medication Sig Dispense Auth. Provider   metroNIDAZOLE  (FLAGYL ) 500 MG tablet Take 1 tablet (500 mg total) by mouth 2 (two) times daily for 7 days. 14 tablet Chinara Hertzberg E, PA-C   fluconazole  (DIFLUCAN ) 150 MG tablet Take 1 tablet (150 mg total) by mouth daily. -For your yeast infection, start the Diflucan  (fluconazole )- Take one pill today (day 1). If you're still having symptoms in 3 days, take the second pill. 2 tablet Jahki Witham E, PA-C      PDMP not reviewed this encounter.   Arlyss Leita BRAVO, PA-C 01/28/24 1651

## 2024-01-28 NOTE — Discharge Instructions (Addendum)
-  For bacterial vaginosis, start the antibiotic-Flagyl  (metronidazole ), 2 pills daily for 7 days.  You can take this with food if you have a sensitive stomach.  Avoid alcohol while taking this medication and for 2 days after as this will cause severe nausea and vomiting. -I sent Diflucan  to have on hand for yeast infection. For your yeast infection, start the Diflucan  (fluconazole )- Take one pill today (day 1). If you're still having symptoms in 3 days, take the second pill -We will call with any positive results in about 3 business days and can send treatment if necessary. -We will not call with negative lab results. -Lab results will automatically go to your MyChart.

## 2024-01-29 LAB — CERVICOVAGINAL ANCILLARY ONLY
Bacterial Vaginitis (gardnerella): NEGATIVE
Candida Glabrata: NEGATIVE
Candida Vaginitis: NEGATIVE
Chlamydia: NEGATIVE
Comment: NEGATIVE
Comment: NEGATIVE
Comment: NEGATIVE
Comment: NEGATIVE
Comment: NEGATIVE
Comment: NORMAL
Neisseria Gonorrhea: NEGATIVE
Trichomonas: NEGATIVE
